# Patient Record
Sex: Female | Born: 1990 | Race: White | Hispanic: Yes | Marital: Married | Smoking: Never smoker
Health system: Southern US, Community
[De-identification: ages and names within clinical notes are randomized; demographics above are authoritative.]

## PROBLEM LIST (undated history)

## (undated) DIAGNOSIS — Z789 Other specified health status: Secondary | ICD-10-CM

## (undated) HISTORY — PX: NO PAST SURGERIES: SHX2092

---
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
## 2010-08-24 ENCOUNTER — Ambulatory Visit: Payer: Self-pay | Admitting: Gynecology

## 2010-10-17 ENCOUNTER — Encounter: Payer: Self-pay | Admitting: Pain Medicine

## 2012-02-29 ENCOUNTER — Ambulatory Visit (INDEPENDENT_AMBULATORY_CARE_PROVIDER_SITE_OTHER): Payer: 59

## 2012-02-29 ENCOUNTER — Ambulatory Visit (INDEPENDENT_AMBULATORY_CARE_PROVIDER_SITE_OTHER): Payer: 59 | Admitting: Gynecology

## 2012-02-29 ENCOUNTER — Encounter: Payer: Self-pay | Admitting: Gynecology

## 2012-02-29 VITALS — BP 110/70 | Ht 62.0 in | Wt 134.5 lb

## 2012-02-29 DIAGNOSIS — R102 Pelvic and perineal pain: Secondary | ICD-10-CM

## 2012-02-29 DIAGNOSIS — Z975 Presence of (intrauterine) contraceptive device: Secondary | ICD-10-CM

## 2012-02-29 DIAGNOSIS — N898 Other specified noninflammatory disorders of vagina: Secondary | ICD-10-CM

## 2012-02-29 DIAGNOSIS — N921 Excessive and frequent menstruation with irregular cycle: Secondary | ICD-10-CM

## 2012-02-29 DIAGNOSIS — R1032 Left lower quadrant pain: Secondary | ICD-10-CM

## 2012-02-29 DIAGNOSIS — N899 Noninflammatory disorder of vagina, unspecified: Secondary | ICD-10-CM

## 2012-02-29 DIAGNOSIS — N949 Unspecified condition associated with female genital organs and menstrual cycle: Secondary | ICD-10-CM

## 2012-02-29 DIAGNOSIS — N8300 Follicular cyst of ovary, unspecified side: Secondary | ICD-10-CM

## 2012-02-29 DIAGNOSIS — R300 Dysuria: Secondary | ICD-10-CM

## 2012-02-29 LAB — URINALYSIS W MICROSCOPIC + REFLEX CULTURE
Crystals: NONE SEEN
Glucose, UA: NEGATIVE mg/dL
Leukocytes, UA: NEGATIVE
Protein, ur: 30 mg/dL — AB
RBC / HPF: NONE SEEN RBC/hpf (ref <3)
WBC, UA: NONE SEEN WBC/hpf (ref <3)
pH: 5.5 (ref 5.0–8.0)

## 2012-02-29 LAB — WET PREP FOR TRICH, YEAST, CLUE
Clue Cells Wet Prep HPF POC: NONE SEEN
Trich, Wet Prep: NONE SEEN
Yeast Wet Prep HPF POC: NONE SEEN

## 2012-02-29 MED ORDER — METRONIDAZOLE 500 MG PO TABS: 500.0000 mg | ORAL_TABLET | Freq: Two times a day (BID) | ORAL | Status: AC

## 2012-02-29 NOTE — Progress Notes (Signed)
Patient is a 21 year old gravida 1 para 1 who is a Holiday representative at [employer] who presented to the office today with a complaint of vaginal irritation slight burning after wiping post urination. On and off she has some right lower quadrant discomfort. And was having some spotting on the Mirena IUD. She had her IUD placed in December 2012 during postpartum visit. She stated she spotted for the first 3 months after its insertion and was treated probably for BV. She also is requesting have her IUD string trimmed.  Exam: Pelvic: Bartholin urethra Skene was within normal limits Vagina: Brownish discharge in the vault Cervix: IUD string seen and trimmed  Ultrasound today for assessment of position of the IUD as well as the right adnexal discomfort that patient has experienced on and off the past few months.  Urinalysis few bacteria specimen submitted for culture Wet prep moderate amount of bacteria and a few white blood cells.  Ultrasound report: Uterus measured 8.8 x 6.1 x 3.3 cm with endometrial stripe of 2.8 mm. IUD was seen in normal position. Right ovary with a small 18 mm follicle otherwise normal. Left R. was normal. No fluid in the cul-de-sac. No masses on either adnexa.  Assessment/plan: Wet prep suspicious for mild BV and she will be placed on Flagyl 500 mg twice a day for 5 days. We'll wait for urine culture results.

## 2012-02-29 NOTE — Patient Instructions (Signed)
Prescription in pharmacy ready to be picked up.

## 2012-03-02 LAB — URINE CULTURE: Colony Count: NO GROWTH

## 2012-03-05 ENCOUNTER — Other Ambulatory Visit: Payer: Self-pay | Admitting: *Deleted

## 2012-03-05 DIAGNOSIS — R8290 Unspecified abnormal findings in urine: Secondary | ICD-10-CM

## 2012-03-06 ENCOUNTER — Telehealth: Payer: Self-pay | Admitting: *Deleted

## 2012-03-06 ENCOUNTER — Other Ambulatory Visit: Payer: 59

## 2012-03-06 DIAGNOSIS — R8290 Unspecified abnormal findings in urine: Secondary | ICD-10-CM

## 2012-03-06 NOTE — Telephone Encounter (Signed)
Pt left urine culture this am and left message asking when will results return? Left message on voicemail that it take a couple of day for results to return.

## 2012-03-07 LAB — URINALYSIS W MICROSCOPIC + REFLEX CULTURE
Bacteria, UA: NONE SEEN
Glucose, UA: NEGATIVE mg/dL
Hgb urine dipstick: NEGATIVE
Leukocytes, UA: NEGATIVE
Nitrite: NEGATIVE
Specific Gravity, Urine: 1.03 (ref 1.005–1.030)
Urobilinogen, UA: 0.2 mg/dL (ref 0.0–1.0)

## 2012-03-09 ENCOUNTER — Telehealth: Payer: Self-pay | Admitting: *Deleted

## 2012-03-09 NOTE — Telephone Encounter (Signed)
Pt informed of recent urine culture results.

## 2013-06-14 ENCOUNTER — Telehealth: Payer: Self-pay | Admitting: *Deleted

## 2013-06-14 NOTE — Telephone Encounter (Signed)
Pt said she has noticed some very light blood after intercourse when wiping. Pt has IUD, no pain with intercourse, no bleeding after. Pt will watch for now and call back if this should become more frequent or heavy or if pain should start.

## 2013-11-22 ENCOUNTER — Encounter: Payer: Self-pay | Admitting: Gynecology

## 2013-11-22 ENCOUNTER — Ambulatory Visit (INDEPENDENT_AMBULATORY_CARE_PROVIDER_SITE_OTHER): Payer: 59 | Admitting: Gynecology

## 2013-11-22 DIAGNOSIS — N949 Unspecified condition associated with female genital organs and menstrual cycle: Secondary | ICD-10-CM

## 2013-11-22 DIAGNOSIS — R102 Pelvic and perineal pain: Secondary | ICD-10-CM

## 2013-11-22 LAB — URINALYSIS W MICROSCOPIC + REFLEX CULTURE
Bilirubin Urine: NEGATIVE
Glucose, UA: NEGATIVE mg/dL
HGB URINE DIPSTICK: NEGATIVE
KETONES UR: NEGATIVE mg/dL
Leukocytes, UA: NEGATIVE
Nitrite: NEGATIVE
PH: 7 (ref 5.0–8.0)
Protein, ur: NEGATIVE mg/dL
Specific Gravity, Urine: 1.02 (ref 1.005–1.030)
Urobilinogen, UA: 1 mg/dL (ref 0.0–1.0)

## 2013-11-22 NOTE — Progress Notes (Signed)
   Patient presented to the office today stating that this morning when she woke up she felt some pressure her left lower abdomen and the first time that she voided. Afterwards she has not had any dysuria, frequency, nausea, vomiting, or any back pain. Patient has a [employer]ear[employer] Adjutant[employer]ina IUD at her last cycle was approximately one week ago. She is in the process of getting married next few months. She is in a monogamous relationship.  Exam: Abdomen: Soft nontender no rebound or guarding Pelvic: And urethra Skene was within normal limits Vagina: No lesions or discharge Cervix: No lesions or discharge IUD string seen Bimanual exam: Uterus anteverted normal size shape and consistency nontender Adnexa: No palpable mass or tenderness Rectal exam: Not done  Urinalysis negative  Assessment/plan: Nonspecific left lower quadrant discomfort isolated event probable ovulatory nature? Patient was reassured. We'll send for urine culture. Patient scheduled to return back For annual exam or when necessary.

## 2013-11-24 LAB — URINE CULTURE
Colony Count: NO GROWTH
Organism ID, Bacteria: NO GROWTH

## 2013-11-26 ENCOUNTER — Telehealth: Payer: Self-pay | Admitting: *Deleted

## 2013-11-26 DIAGNOSIS — R102 Pelvic and perineal pain: Secondary | ICD-10-CM

## 2013-11-26 NOTE — Telephone Encounter (Signed)
Pt was seen on OV 11/22/13 c/o pelvic pain on left side. Pt said on Sunday right side pain started sharp pain, continues, takes OTC but no relief. No urinary problems, nausea, vomiting. Pt has IUD,recommendations? Please advise

## 2013-11-26 NOTE — Telephone Encounter (Signed)
Ultrasound with office visit

## 2013-11-26 NOTE — Telephone Encounter (Signed)
Pt informed, order placed, transferred to front desk. 

## 2013-11-27 ENCOUNTER — Other Ambulatory Visit: Payer: Self-pay | Admitting: Gynecology

## 2013-11-27 ENCOUNTER — Ambulatory Visit (INDEPENDENT_AMBULATORY_CARE_PROVIDER_SITE_OTHER): Payer: 59

## 2013-11-27 ENCOUNTER — Ambulatory Visit (INDEPENDENT_AMBULATORY_CARE_PROVIDER_SITE_OTHER): Payer: 59 | Admitting: Gynecology

## 2013-11-27 DIAGNOSIS — R102 Pelvic and perineal pain unspecified side: Secondary | ICD-10-CM

## 2013-11-27 DIAGNOSIS — Z30432 Encounter for removal of intrauterine contraceptive device: Secondary | ICD-10-CM

## 2013-11-27 DIAGNOSIS — R188 Other ascites: Secondary | ICD-10-CM

## 2013-11-27 DIAGNOSIS — N83209 Unspecified ovarian cyst, unspecified side: Secondary | ICD-10-CM

## 2013-11-27 DIAGNOSIS — R1032 Left lower quadrant pain: Secondary | ICD-10-CM

## 2013-11-27 DIAGNOSIS — N831 Corpus luteum cyst of ovary, unspecified side: Secondary | ICD-10-CM

## 2013-11-27 DIAGNOSIS — N949 Unspecified condition associated with female genital organs and menstrual cycle: Secondary | ICD-10-CM

## 2013-11-27 DIAGNOSIS — N83202 Unspecified ovarian cyst, left side: Secondary | ICD-10-CM

## 2013-11-27 LAB — CBC WITH DIFFERENTIAL/PLATELET
Basophils Absolute: 0 10*3/uL (ref 0.0–0.1)
Basophils Relative: 0 % (ref 0–1)
Eosinophils Absolute: 0.1 10*3/uL (ref 0.0–0.7)
Eosinophils Relative: 2 % (ref 0–5)
HCT: 40.7 % (ref 36.0–46.0)
Hemoglobin: 13.6 g/dL (ref 12.0–15.0)
LYMPHS PCT: 44 % (ref 12–46)
Lymphs Abs: 3.2 10*3/uL (ref 0.7–4.0)
MCH: 28.6 pg (ref 26.0–34.0)
MCHC: 33.4 g/dL (ref 30.0–36.0)
MCV: 85.7 fL (ref 78.0–100.0)
MONO ABS: 0.3 10*3/uL (ref 0.1–1.0)
Monocytes Relative: 4 % (ref 3–12)
NEUTROS ABS: 3.7 10*3/uL (ref 1.7–7.7)
NEUTROS PCT: 50 % (ref 43–77)
PLATELETS: 302 10*3/uL (ref 150–400)
RBC: 4.75 MIL/uL (ref 3.87–5.11)
RDW: 13 % (ref 11.5–15.5)
WBC: 7.3 10*3/uL (ref 4.0–10.5)

## 2013-11-27 LAB — URINALYSIS W MICROSCOPIC + REFLEX CULTURE
BILIRUBIN URINE: NEGATIVE
CRYSTALS: NONE SEEN
Casts: NONE SEEN
Glucose, UA: NEGATIVE mg/dL
KETONES UR: NEGATIVE mg/dL
Nitrite: NEGATIVE
PH: 8.5 — AB (ref 5.0–8.0)
Protein, ur: NEGATIVE mg/dL
SPECIFIC GRAVITY, URINE: 1.015 (ref 1.005–1.030)
Urobilinogen, UA: 1 mg/dL (ref 0.0–1.0)

## 2013-11-27 MED ORDER — TRAMADOL HCL 50 MG PO TABS: 50.0000 mg | ORAL_TABLET | Freq: Four times a day (QID) | ORAL | Status: DC | PRN

## 2013-11-27 MED ORDER — DOXYCYCLINE HYCLATE 100 MG PO CAPS: 100.0000 mg | ORAL_CAPSULE | Freq: Two times a day (BID) | ORAL | Status: DC

## 2013-11-27 NOTE — Patient Instructions (Addendum)

## 2013-11-28 NOTE — Progress Notes (Signed)
   Is a 23 year old who had a Mirena IUD placed 2 years ago was seen in the office once every 27th of this year complaining of left lower abdominal discomfort especially when she voided. She denied any dysuria, frequency, nausea, vomiting or any back pain. She stated that she had a normal menstrual cycle the week prior to that visit. She is in a monogamous relationship in the process of getting married. She had a negative urinalysis during that office visit and the IUD string was visualized and we assumed that the discomfort that she experiences ovulatory nature. Her urine culture came back negative. She called back tissue still having discomfort now is on her right side and in her back although she had been doing some lifting recently. She was asked to return to the office for an ultrasound. She has also decided she wants to have her IUD removed. Her urinalysis was repeated again which demonstrated the following:  WBC: 306 RBC 0-2 Bacteria: Few Culture pending  Ultrasound: Uterus measures 8.9 x 5.8 x 4.3 cm with endometrial stripe of 3.4 mm. Right ovary was normal a left thick wall cyst anterolateral level echoes measuring 22 x 21 x 20 mm was done with color flow in the periphery. There was moderate amount of fluid in the cul-de-sac (50 about 42 x 30 mm)  Exam: Mild right CVA tenderness Abdomen: Soft nontender no rebound or guarding Pelvic: Since we did an ultrasound the IUD was just removed. Was shown to the patient and discarded  Assessment/plan: It appears the discomfort the patient had experienced as a result of a ruptured ovarian cyst. We will check her CBC today. Urine culture pending. She is going to abstain from intercourse until she gets very next few months and then she will use condoms or the oral contraceptive pills. She is scheduled to return back at the end of the month for her anal exam and will reassess at that point. Meanwhile she was given a prescription for Ultram 50 mg one by  mouth every 6 hours when necessary.  She'll return back in July for a followup ultrasound on this ovarian cyst which appears to be benign.

## 2013-11-29 LAB — URINE CULTURE
COLONY COUNT: NO GROWTH
Organism ID, Bacteria: NO GROWTH

## 2013-12-10 ENCOUNTER — Encounter: Payer: Self-pay | Admitting: Gynecology

## 2014-03-05 ENCOUNTER — Telehealth: Payer: Self-pay

## 2014-03-05 NOTE — Telephone Encounter (Signed)
Has Mirena IUD removed in early March and menses have been regular since.  However this month is seems to be delayed by a week or so but she does feel like she might start anytime.  She is inquiring if that is normal after removal IUD that menses could be regular and then be irregular.

## 2014-03-05 NOTE — Telephone Encounter (Signed)
Yes. Cycle are considered normal q 21-35 days.

## 2014-03-05 NOTE — Telephone Encounter (Signed)
Patient advised.

## 2014-07-28 ENCOUNTER — Encounter: Payer: Self-pay | Admitting: Gynecology

## 2014-09-26 NOTE — L&D Delivery Note (Signed)
Delivery Note At 4:31 PM a viable female was delivered via Vaginal, Spontaneous Delivery (Presentation: Right Occiput Anterior).  APGAR: 9, 9; weight  .   Placenta status: , Spontaneous.  Cord: 3 vessels with the following complications: .  Cord pH: not sent  Anesthesia: Epidural  Episiotomy: None Lacerations: 2nd degree;Periurethral Suture Repair: 3.0 vicryl rapide Est. Blood Loss (mL):  300  Mom to postpartum.  Baby to Couplet care / Skin to Skin.  Meriel Pica 06/29/2015, 4:58 PM

## 2014-11-27 LAB — OB RESULTS CONSOLE HIV ANTIBODY (ROUTINE TESTING): HIV: NONREACTIVE

## 2014-11-27 LAB — OB RESULTS CONSOLE RUBELLA ANTIBODY, IGM: RUBELLA: IMMUNE

## 2014-11-27 LAB — OB RESULTS CONSOLE RPR: RPR: NONREACTIVE

## 2015-06-04 LAB — OB RESULTS CONSOLE GC/CHLAMYDIA
CHLAMYDIA, DNA PROBE: NEGATIVE
Gonorrhea: NEGATIVE

## 2015-06-29 ENCOUNTER — Inpatient Hospital Stay (HOSPITAL_COMMUNITY): Payer: 59 | Admitting: Anesthesiology

## 2015-06-29 ENCOUNTER — Encounter (HOSPITAL_COMMUNITY): Payer: Self-pay | Admitting: *Deleted

## 2015-06-29 ENCOUNTER — Inpatient Hospital Stay (HOSPITAL_COMMUNITY)
Admission: AD | Admit: 2015-06-29 | Discharge: 2015-07-01 | DRG: 775 | Disposition: A | Discharge status: Home / Self Care | Payer: 59 | Source: Ambulatory Visit | Attending: Obstetrics and Gynecology | Admitting: Obstetrics and Gynecology

## 2015-06-29 DIAGNOSIS — Z3A39 39 weeks gestation of pregnancy: Secondary | ICD-10-CM

## 2015-06-29 DIAGNOSIS — IMO0001 Reserved for inherently not codable concepts without codable children: Secondary | ICD-10-CM

## 2015-06-29 HISTORY — DX: Other specified health status: Z78.9

## 2015-06-29 LAB — CBC
HCT: 36.9 % (ref 36.0–46.0)
Hemoglobin: 12.5 g/dL (ref 12.0–15.0)
MCH: 28.9 pg (ref 26.0–34.0)
MCHC: 33.9 g/dL (ref 30.0–36.0)
MCV: 85.4 fL (ref 78.0–100.0)
PLATELETS: 193 10*3/uL (ref 150–400)
RBC: 4.32 MIL/uL (ref 3.87–5.11)
RDW: 13.1 % (ref 11.5–15.5)
WBC: 9.7 10*3/uL (ref 4.0–10.5)

## 2015-06-29 LAB — TYPE AND SCREEN
ABO/RH(D): O POS
Antibody Screen: NEGATIVE

## 2015-06-29 LAB — ABO/RH: ABO/RH(D): O POS

## 2015-06-29 LAB — OB RESULTS CONSOLE GBS: STREP GROUP B AG: NEGATIVE

## 2015-06-29 MED ORDER — FENTANYL 2.5 MCG/ML BUPIVACAINE 1/10 % EPIDURAL INFUSION (WH - ANES): 14.0000 mL/h | INTRAMUSCULAR | Status: DC | PRN

## 2015-06-29 MED ORDER — ONDANSETRON HCL 4 MG PO TABS: 4.0000 mg | ORAL_TABLET | ORAL | Status: DC | PRN

## 2015-06-29 MED ORDER — ZOLPIDEM TARTRATE 5 MG PO TABS: 5.0000 mg | ORAL_TABLET | Freq: Every evening | ORAL | Status: DC | PRN

## 2015-06-29 MED ORDER — ONDANSETRON HCL 4 MG/2ML IJ SOLN
4.0000 mg | Freq: Four times a day (QID) | INTRAMUSCULAR | Status: DC | PRN
  Administered 2015-06-29: 4 mg via INTRAVENOUS
  Filled 2015-06-29: qty 2

## 2015-06-29 MED ORDER — EPHEDRINE 5 MG/ML INJ: 10.0000 mg | INTRAVENOUS | Status: DC | PRN

## 2015-06-29 MED ORDER — DIBUCAINE 1 % RE OINT: 1.0000 "application " | TOPICAL_OINTMENT | RECTAL | Status: DC | PRN

## 2015-06-29 MED ORDER — BISACODYL 10 MG RE SUPP: 10.0000 mg | Freq: Every day | RECTAL | Status: DC | PRN

## 2015-06-29 MED ORDER — OXYTOCIN BOLUS FROM INFUSION: 500.0000 mL | INTRAVENOUS | Status: DC

## 2015-06-29 MED ORDER — OXYTOCIN 40 UNITS IN LACTATED RINGERS INFUSION - SIMPLE MED
62.5000 mL/h | INTRAVENOUS | Status: DC
  Filled 2015-06-29: qty 1000

## 2015-06-29 MED ORDER — CITRIC ACID-SODIUM CITRATE 334-500 MG/5ML PO SOLN: 30.0000 mL | ORAL | Status: DC | PRN

## 2015-06-29 MED ORDER — SIMETHICONE 80 MG PO CHEW: 80.0000 mg | CHEWABLE_TABLET | ORAL | Status: DC | PRN

## 2015-06-29 MED ORDER — DIPHENHYDRAMINE HCL 25 MG PO CAPS: 25.0000 mg | ORAL_CAPSULE | Freq: Four times a day (QID) | ORAL | Status: DC | PRN

## 2015-06-29 MED ORDER — TERBUTALINE SULFATE 1 MG/ML IJ SOLN
0.2500 mg | Freq: Once | INTRAMUSCULAR | Status: DC | PRN
Start: 2015-06-29 — End: 2015-06-29

## 2015-06-29 MED ORDER — OXYTOCIN 40 UNITS IN LACTATED RINGERS INFUSION - SIMPLE MED
1.0000 m[IU]/min | INTRAVENOUS | Status: DC
  Administered 2015-06-29: 2 m[IU]/min via INTRAVENOUS

## 2015-06-29 MED ORDER — OXYCODONE-ACETAMINOPHEN 5-325 MG PO TABS: 1.0000 | ORAL_TABLET | ORAL | Status: DC | PRN

## 2015-06-29 MED ORDER — OXYCODONE-ACETAMINOPHEN 5-325 MG PO TABS: 2.0000 | ORAL_TABLET | ORAL | Status: DC | PRN

## 2015-06-29 MED ORDER — FENTANYL 2.5 MCG/ML BUPIVACAINE 1/10 % EPIDURAL INFUSION (WH - ANES)
14.0000 mL/h | INTRAMUSCULAR | Status: DC | PRN
  Filled 2015-06-29: qty 125

## 2015-06-29 MED ORDER — PHENYLEPHRINE 40 MCG/ML (10ML) SYRINGE FOR IV PUSH (FOR BLOOD PRESSURE SUPPORT)
80.0000 ug | PREFILLED_SYRINGE | INTRAVENOUS | Status: DC | PRN
  Administered 2015-06-29 (×2): 80 ug via INTRAVENOUS
  Filled 2015-06-29: qty 20

## 2015-06-29 MED ORDER — MEASLES, MUMPS & RUBELLA VAC ~~LOC~~ INJ: 0.5000 mL | INJECTION | Freq: Once | SUBCUTANEOUS | Status: DC

## 2015-06-29 MED ORDER — LANOLIN HYDROUS EX OINT: TOPICAL_OINTMENT | CUTANEOUS | Status: DC | PRN

## 2015-06-29 MED ORDER — FLEET ENEMA 7-19 GM/118ML RE ENEM: 1.0000 | ENEMA | Freq: Every day | RECTAL | Status: DC | PRN

## 2015-06-29 MED ORDER — WITCH HAZEL-GLYCERIN EX PADS
1.0000 "application " | MEDICATED_PAD | CUTANEOUS | Status: DC | PRN
  Administered 2015-06-30: 1 via TOPICAL

## 2015-06-29 MED ORDER — TETANUS-DIPHTH-ACELL PERTUSSIS 5-2.5-18.5 LF-MCG/0.5 IM SUSP: 0.5000 mL | Freq: Once | INTRAMUSCULAR | Status: DC

## 2015-06-29 MED ORDER — LIDOCAINE HCL (PF) 1 % IJ SOLN
30.0000 mL | INTRAMUSCULAR | Status: DC | PRN
  Administered 2015-06-29: 30 mL via SUBCUTANEOUS
  Filled 2015-06-29: qty 30

## 2015-06-29 MED ORDER — LACTATED RINGERS IV SOLN: 500.0000 mL | INTRAVENOUS | Status: DC | PRN

## 2015-06-29 MED ORDER — SENNOSIDES-DOCUSATE SODIUM 8.6-50 MG PO TABS
2.0000 | ORAL_TABLET | ORAL | Status: DC
  Administered 2015-06-29 – 2015-06-30 (×2): 2 via ORAL
  Filled 2015-06-29 (×2): qty 2

## 2015-06-29 MED ORDER — INFLUENZA VAC SPLIT QUAD 0.5 ML IM SUSY
0.5000 mL | PREFILLED_SYRINGE | INTRAMUSCULAR | Status: AC
  Administered 2015-06-30: 0.5 mL via INTRAMUSCULAR
  Filled 2015-06-29: qty 0.5

## 2015-06-29 MED ORDER — DIPHENHYDRAMINE HCL 50 MG/ML IJ SOLN: 12.5000 mg | INTRAMUSCULAR | Status: DC | PRN

## 2015-06-29 MED ORDER — ONDANSETRON HCL 4 MG/2ML IJ SOLN: 4.0000 mg | INTRAMUSCULAR | Status: DC | PRN

## 2015-06-29 MED ORDER — FLEET ENEMA 7-19 GM/118ML RE ENEM: 1.0000 | ENEMA | RECTAL | Status: DC | PRN

## 2015-06-29 MED ORDER — ACETAMINOPHEN 325 MG PO TABS
650.0000 mg | ORAL_TABLET | ORAL | Status: DC | PRN
Start: 2015-06-29 — End: 2015-06-29

## 2015-06-29 MED ORDER — ACETAMINOPHEN 325 MG PO TABS: 650.0000 mg | ORAL_TABLET | ORAL | Status: DC | PRN

## 2015-06-29 MED ORDER — BENZOCAINE-MENTHOL 20-0.5 % EX AERO
1.0000 "application " | INHALATION_SPRAY | CUTANEOUS | Status: DC | PRN
  Administered 2015-06-29: 1 via TOPICAL
  Filled 2015-06-29: qty 56

## 2015-06-29 MED ORDER — LACTATED RINGERS IV SOLN
INTRAVENOUS | Status: DC
  Administered 2015-06-29: 08:00:00 via INTRAVENOUS

## 2015-06-29 MED ORDER — IBUPROFEN 800 MG PO TABS
800.0000 mg | ORAL_TABLET | Freq: Three times a day (TID) | ORAL | Status: DC | PRN
  Administered 2015-06-29 – 2015-07-01 (×4): 800 mg via ORAL
  Filled 2015-06-29 (×4): qty 1

## 2015-06-29 MED ORDER — PRENATAL MULTIVITAMIN CH
1.0000 | ORAL_TABLET | Freq: Every day | ORAL | Status: DC
  Administered 2015-06-30: 1 via ORAL
  Filled 2015-06-29: qty 1

## 2015-06-29 NOTE — MAU Note (Signed)
Pt presents with regular contractions for the last 4 hours. Denies bleeding or ROM.

## 2015-06-29 NOTE — H&P (Signed)
Deanna Aguirre is a 24 y.o. female presenting for SOL. Maternal Medical History:  Reason for admission: Contractions.   Contractions: Onset was 3-5 hours ago.   Frequency: regular.   Perceived severity is moderate.    Fetal activity: Perceived fetal activity is normal.   Last perceived fetal movement was within the past hour.      OB History    Gravida Para Term Preterm AB TAB SAB Ectopic Multiple Living   Past Medical History  Diagnosis Date  . Medical history non-contributory    Past Surgical History  Procedure Laterality Date  . No past surgeries     Family History: family history is not on file. Social History:  reports that she has never smoked. She does not have any smokeless tobacco history on file. She reports that she does not drink alcohol or use illicit drugs.   Prenatal Transfer Tool  Maternal Diabetes: No Genetic Screening: Normal Maternal Ultrasounds/Referrals: Normal Fetal Ultrasounds or other Referrals:  None Maternal Substance Abuse:  No Significant Maternal Medications:  None Significant Maternal Lab Results:  None Other Comments:  None  ROS  Dilation: 5 Effacement (%): 60 Station: -2 Exam by:: L. Paschal, Rn  Blood pressure 129/70, pulse 87, temperature 97.6 F (36.4 C), temperature source Oral, resp. rate 16, height  (1.626 m), weight 158 lb (71.668 kg), SpO2 99 %. Exam Physical Exam  Constitutional: She is oriented to person, place, and time. She appears well-developed and well-nourished.  HENT:  Head: Normocephalic and atraumatic.  Neck: Normal range of motion. Neck supple.  Cardiovascular: Normal rate and regular rhythm.   Respiratory: Effort normal and breath sounds normal.  GI:  Term FH FHR 148  Genitourinary:  5/C/BOW I  Musculoskeletal: Normal range of motion.  Neurological: She is alert and oriented to person, place, and time.    Prenatal labs: ABO, Rh:   Antibody:   Rubella:   RPR:    HBsAg:    HIV:     GBS:   NEG  Assessment/Plan: Term IUP>>labor   Chelsey Kimberley M 06/29/2015, 8:15 AM

## 2015-06-29 NOTE — Lactation Note (Signed)
This note was copied from the chart of Deanna Aguirre. Lactation Consultation Note  Patient Name: Deanna Aguirre ZOXWR'U Date: 06/29/2015 Reason for consult: Initial assessment Mom has 40m bf experience. She stated the everything went well with her older daughter until her milk dried up after she returned to school. Upon entry the baby was being passed around to different families members. Baby was swaddled then wrapped a larger blanket. Baby started to scream, when FOB unwrapped baby to attempt latching she was red and hot. Baby was not interested in the breast or sucking on a finger. Mom did not seem like she really wanted hands on help. After 2-3 minutes of attempting latch mom put baby skin to skin and she stopped crying. Mom made the comment that she would just formula feed if the baby keeps crying, she stated that the baby is very hungry but does not how to latch yet. Briefly talked about milk production, feeding frequency in the first 24 hr, artificial nipples, and soothing. Mom seemed to be shutting down. Left lactation information and informed her of lactation services along with support group.      Maternal Data Has patient been taught Hand Expression?: Yes Does the patient have breastfeeding experience prior to this delivery?: Yes  Feeding Feeding Type: Breast Fed Length of feed: 0 min (baby was screaming then fell asleep )  LATCH Score/Interventions                      Lactation Tools Discussed/Used     Consult Status Consult Status: Follow-up Date: 06/30/15 Follow-up type: In-patient    Rulon Eisenmenger 06/29/2015, 8:25 PM

## 2015-06-29 NOTE — Anesthesia Preprocedure Evaluation (Signed)

## 2015-06-29 NOTE — Progress Notes (Signed)
Epidural catheter removed. Catheter tip intact. 18:00

## 2015-06-30 ENCOUNTER — Inpatient Hospital Stay (HOSPITAL_COMMUNITY): Admission: RE | Admit: 2015-06-30 | Payer: 59 | Source: Ambulatory Visit

## 2015-06-30 LAB — CBC
HEMATOCRIT: 29.5 % — AB (ref 36.0–46.0)
Hemoglobin: 10 g/dL — ABNORMAL LOW (ref 12.0–15.0)
MCH: 29.1 pg (ref 26.0–34.0)
MCHC: 33.9 g/dL (ref 30.0–36.0)
MCV: 85.8 fL (ref 78.0–100.0)
Platelets: 172 10*3/uL (ref 150–400)
RBC: 3.44 MIL/uL — ABNORMAL LOW (ref 3.87–5.11)
RDW: 13.2 % (ref 11.5–15.5)
WBC: 10.6 10*3/uL — AB (ref 4.0–10.5)

## 2015-06-30 LAB — RPR: RPR: NONREACTIVE

## 2015-06-30 NOTE — Anesthesia Postprocedure Evaluation (Signed)
  Anesthesia Post-op Note  Patient: Deanna Aguirre  Procedure(s) Performed: * No procedures listed *  Patient Location: Mother/Baby  Anesthesia Type:Epidural  Level of Consciousness: awake  Airway and Oxygen Therapy: Patient Spontanous Breathing  Post-op Pain: mild  Post-op Assessment: Patient's Cardiovascular Status Stable and Respiratory Function Stable              Post-op Vital Signs: stable  Last Vitals:  Filed Vitals:   06/30/15 0545  BP: 102/66  Pulse: 61  Temp: 36.4 C  Resp: 18    Complications: No apparent anesthesia complications

## 2015-06-30 NOTE — Progress Notes (Signed)
Post Partum Day 1 Subjective: no complaints, up ad lib, voiding and tolerating PO  Objective: Blood pressure 114/71, pulse 59, temperature 97.7 F (36.5 C), temperature source Oral, resp. rate 18, height  (1.626 m), weight 158 lb (71.668 kg), SpO2 100 %, unknown if currently breastfeeding.  Physical Exam:  General: alert, cooperative, appears stated age and no distress Lochia: appropriate Uterine Fundus: firm Incision: healing well DVT Evaluation: No evidence of DVT seen on physical exam.   Recent Labs  06/29/15 0755 06/30/15 0508  HGB 12.5 10.0*  HCT 36.9 29.5*    Assessment/Plan: Plan for discharge tomorrow and Breastfeeding   LOS: 1 day   Tagen Brethauer C 06/30/2015, 10:14 AM

## 2015-07-01 MED ORDER — IBUPROFEN 800 MG PO TABS: 800.0000 mg | ORAL_TABLET | Freq: Three times a day (TID) | ORAL | Status: DC | PRN

## 2015-07-01 NOTE — Lactation Note (Signed)
This note was copied from the chart of [name]. Lactation Consultation Note  Follow up with mom prior to D/C. Mom BF first child for 3 months and reported that milk supply dwindled when she returned to school. Infant had just fed when I was in room. Mom's RN encouraged mom to call for next feeding for feeding assessment. Infant with 11 BF 5 voids and 3 stools in last 24 hours. Mom reports that infant does not open wide and often has her upper lip curled in, which mom flanges as needed. Mom reports minimal nipple soreness, faint bruise noted to upper right nipple, encouraged mom to express colostrum and let air dry, mom reports she has been doing this. Mom has large breasts with evert nipples, she reports they are feeling fuller today. BF Basics reviewed including using more pillows to support infant at breast. Discussed continuing to feed 8-12 x in 24 hours at first feeding cues. Enc mom to maintain I/O records. Referred mom to BF Information in Taking Care of Baby and Me Booklet. Enc mom to avoid pacifiers for first 2 weeks and reasonings, she has been using pacifier on infant in hospital that she brought from home. [facility] Brochure discussed, reiterated Support Groups and OP services. Mom is to call Ped to make follow up appointment. Mom has a Medela DEBP @ home. Discussed pumping and introduction of bottles between 4-6 weeks. Enc. Mom to call with questions/concerns.  Patient Name: [name] Date: 07/01/2015 Reason for consult: Follow-up assessment   Maternal Data Formula Feeding for Exclusion: No Does the patient have breastfeeding experience prior to this delivery?: Yes  Feeding Feeding Type: Breast Fed Length of feed: 15 min  LATCH Score/Interventions                      Lactation Tools Discussed/Used     Consult Status Consult Status: Complete Follow-up type: Call as needed    Ed Blalock 07/01/2015, 8:23 AM

## 2015-07-01 NOTE — Discharge Summary (Signed)
Obstetric Discharge Summary Reason for Admission: onset of labor Prenatal Procedures: ultrasound Intrapartum Procedures: spontaneous vaginal delivery Postpartum Procedures: none Complications-Operative and Postpartum: 2nd degree perineal laceration HEMOGLOBIN  Date Value Ref Range Status  06/30/2015 10.0* 12.0 - 15.0 g/dL Final   HCT  Date Value Ref Range Status  06/30/2015 29.5* 36.0 - 46.0 % Final    Physical Exam:  General: alert and cooperative Lochia: appropriate Uterine Fundus: firm Incision: na/ DVT Evaluation: No evidence of DVT seen on physical exam.  Discharge Diagnoses: Term Pregnancy-delivered  Discharge Information: Date: 07/01/2015 Activity: pelvic rest Diet: routine Medications: PNV and Ibuprofen Condition: stable Instructions: refer to practice specific booklet Discharge to: home Follow-up Information    Schedule an appointment as soon as possible for a visit in 6 weeks to follow up.      Newborn Data: Live born female  Birth Weight: 8 lb 8.2 oz (3861 g) APGAR: 9, 9  Home with mother.  Deanna Aguirre 07/01/2015, 8:54 AM

## 2016-09-26 NOTE — L&D Delivery Note (Signed)
Delivery Note At 8:07 AM a viable female was delivered via Vaginal, Spontaneous (Presentation: LOA;  ).  APGAR: 9, 10; weight  pending.   Placenta status: routine, .  Cord: WNL with the following complications: nuchal cord x 1, reduced with delivery, body cord x 1, reduced with delivery. Cord pH: not sent  Anesthesia:   Episiotomy: None Lacerations: Vaginal;1st degree;Perineal Suture Repair: 2.0 3.0 vicryl Est. Blood Loss (mL): 200  It's a girl - "Deanna Aguirre" to join her two sisters at home!! Mom to postpartum.  Baby to Couplet care / Skin to Skin.   Deanna Aguirre Deanna Aguirre Deanna Aguirre 09/16/2017, 8:48 AM

## 2017-01-03 ENCOUNTER — Encounter: Payer: Self-pay | Admitting: Gynecology

## 2017-02-08 ENCOUNTER — Encounter: Payer: Self-pay | Admitting: Gynecology

## 2017-06-20 LAB — OB RESULTS CONSOLE HIV ANTIBODY (ROUTINE TESTING): HIV: NONREACTIVE

## 2017-08-30 LAB — OB RESULTS CONSOLE GBS: STREP GROUP B AG: NEGATIVE

## 2017-09-16 ENCOUNTER — Inpatient Hospital Stay (HOSPITAL_COMMUNITY)
Admission: AD | Admit: 2017-09-16 | Discharge: 2017-09-17 | DRG: 807 | Disposition: A | Discharge status: Home / Self Care | Payer: BLUE CROSS/BLUE SHIELD | Source: Ambulatory Visit | Attending: Obstetrics and Gynecology | Admitting: Obstetrics and Gynecology

## 2017-09-16 ENCOUNTER — Other Ambulatory Visit: Payer: Self-pay

## 2017-09-16 ENCOUNTER — Encounter (HOSPITAL_COMMUNITY): Payer: Self-pay | Admitting: *Deleted

## 2017-09-16 ENCOUNTER — Inpatient Hospital Stay (HOSPITAL_COMMUNITY): Payer: BLUE CROSS/BLUE SHIELD | Admitting: Anesthesiology

## 2017-09-16 DIAGNOSIS — Z3A38 38 weeks gestation of pregnancy: Secondary | ICD-10-CM | POA: Diagnosis not present

## 2017-09-16 DIAGNOSIS — Z3483 Encounter for supervision of other normal pregnancy, third trimester: Secondary | ICD-10-CM | POA: Diagnosis present

## 2017-09-16 LAB — CBC
HCT: 38 % (ref 36.0–46.0)
Hemoglobin: 12.9 g/dL (ref 12.0–15.0)
MCH: 28.2 pg (ref 26.0–34.0)
MCHC: 33.9 g/dL (ref 30.0–36.0)
MCV: 83 fL (ref 78.0–100.0)
PLATELETS: 198 10*3/uL (ref 150–400)
RBC: 4.58 MIL/uL (ref 3.87–5.11)
RDW: 14.1 % (ref 11.5–15.5)
WBC: 8.1 10*3/uL (ref 4.0–10.5)

## 2017-09-16 LAB — RPR: RPR: NONREACTIVE

## 2017-09-16 LAB — TYPE AND SCREEN
ABO/RH(D): O POS
Antibody Screen: NEGATIVE

## 2017-09-16 LAB — POCT FERN TEST: POCT Fern Test: POSITIVE

## 2017-09-16 LAB — ABO/RH: ABO/RH(D): O POS

## 2017-09-16 MED ORDER — TETANUS-DIPHTH-ACELL PERTUSSIS 5-2.5-18.5 LF-MCG/0.5 IM SUSP: 0.5000 mL | Freq: Once | INTRAMUSCULAR | Status: DC

## 2017-09-16 MED ORDER — DIBUCAINE 1 % RE OINT: 1.0000 "application " | TOPICAL_OINTMENT | RECTAL | Status: DC | PRN

## 2017-09-16 MED ORDER — LACTATED RINGERS IV SOLN
500.0000 mL | INTRAVENOUS | Status: DC | PRN
  Administered 2017-09-16: 1000 mL via INTRAVENOUS

## 2017-09-16 MED ORDER — LIDOCAINE HCL (PF) 1 % IJ SOLN
INTRAMUSCULAR | Status: DC | PRN
  Administered 2017-09-16 (×2): 4 mL via EPIDURAL

## 2017-09-16 MED ORDER — IBUPROFEN 600 MG PO TABS
600.0000 mg | ORAL_TABLET | Freq: Four times a day (QID) | ORAL | Status: DC
  Administered 2017-09-16 – 2017-09-17 (×3): 600 mg via ORAL
  Filled 2017-09-16 (×3): qty 1

## 2017-09-16 MED ORDER — LACTATED RINGERS IV SOLN: 500.0000 mL | Freq: Once | INTRAVENOUS | Status: AC

## 2017-09-16 MED ORDER — FENTANYL 2.5 MCG/ML BUPIVACAINE 1/10 % EPIDURAL INFUSION (WH - ANES)
14.0000 mL/h | INTRAMUSCULAR | Status: DC | PRN
  Administered 2017-09-16: 14 mL/h via EPIDURAL
  Filled 2017-09-16: qty 100

## 2017-09-16 MED ORDER — ACETAMINOPHEN 325 MG PO TABS: 650.0000 mg | ORAL_TABLET | ORAL | Status: DC | PRN

## 2017-09-16 MED ORDER — DIPHENHYDRAMINE HCL 25 MG PO CAPS: 25.0000 mg | ORAL_CAPSULE | Freq: Four times a day (QID) | ORAL | Status: DC | PRN

## 2017-09-16 MED ORDER — WITCH HAZEL-GLYCERIN EX PADS: 1.0000 "application " | MEDICATED_PAD | CUTANEOUS | Status: DC | PRN

## 2017-09-16 MED ORDER — SENNOSIDES-DOCUSATE SODIUM 8.6-50 MG PO TABS
2.0000 | ORAL_TABLET | ORAL | Status: DC
  Administered 2017-09-16: 2 via ORAL
  Filled 2017-09-16: qty 2

## 2017-09-16 MED ORDER — OXYTOCIN BOLUS FROM INFUSION
500.0000 mL | Freq: Once | INTRAVENOUS | Status: AC
  Administered 2017-09-16: 500 mL via INTRAVENOUS

## 2017-09-16 MED ORDER — OXYCODONE HCL 5 MG PO TABS: 5.0000 mg | ORAL_TABLET | ORAL | Status: DC | PRN

## 2017-09-16 MED ORDER — SIMETHICONE 80 MG PO CHEW: 80.0000 mg | CHEWABLE_TABLET | ORAL | Status: DC | PRN

## 2017-09-16 MED ORDER — EPHEDRINE 5 MG/ML INJ
10.0000 mg | INTRAVENOUS | Status: DC | PRN
  Filled 2017-09-16: qty 2

## 2017-09-16 MED ORDER — OXYCODONE-ACETAMINOPHEN 5-325 MG PO TABS: 1.0000 | ORAL_TABLET | ORAL | Status: DC | PRN

## 2017-09-16 MED ORDER — SOD CITRATE-CITRIC ACID 500-334 MG/5ML PO SOLN: 30.0000 mL | ORAL | Status: DC | PRN

## 2017-09-16 MED ORDER — ONDANSETRON HCL 4 MG PO TABS: 4.0000 mg | ORAL_TABLET | ORAL | Status: DC | PRN

## 2017-09-16 MED ORDER — PHENYLEPHRINE 40 MCG/ML (10ML) SYRINGE FOR IV PUSH (FOR BLOOD PRESSURE SUPPORT)
80.0000 ug | PREFILLED_SYRINGE | INTRAVENOUS | Status: DC | PRN
  Filled 2017-09-16: qty 10
  Filled 2017-09-16: qty 5

## 2017-09-16 MED ORDER — ZOLPIDEM TARTRATE 5 MG PO TABS: 5.0000 mg | ORAL_TABLET | Freq: Every evening | ORAL | Status: DC | PRN

## 2017-09-16 MED ORDER — ONDANSETRON HCL 4 MG/2ML IJ SOLN: 4.0000 mg | Freq: Four times a day (QID) | INTRAMUSCULAR | Status: DC | PRN

## 2017-09-16 MED ORDER — PHENYLEPHRINE 40 MCG/ML (10ML) SYRINGE FOR IV PUSH (FOR BLOOD PRESSURE SUPPORT)
80.0000 ug | PREFILLED_SYRINGE | INTRAVENOUS | Status: AC | PRN
  Administered 2017-09-16 (×3): 80 ug via INTRAVENOUS
  Filled 2017-09-16: qty 10

## 2017-09-16 MED ORDER — BENZOCAINE-MENTHOL 20-0.5 % EX AERO
1.0000 "application " | INHALATION_SPRAY | CUTANEOUS | Status: DC | PRN
  Administered 2017-09-16: 1 via TOPICAL
  Filled 2017-09-16: qty 56

## 2017-09-16 MED ORDER — FLEET ENEMA 7-19 GM/118ML RE ENEM: 1.0000 | ENEMA | RECTAL | Status: DC | PRN

## 2017-09-16 MED ORDER — LACTATED RINGERS IV SOLN
INTRAVENOUS | Status: DC
  Administered 2017-09-16: 06:00:00 via INTRAVENOUS

## 2017-09-16 MED ORDER — ONDANSETRON HCL 4 MG/2ML IJ SOLN: 4.0000 mg | INTRAMUSCULAR | Status: DC | PRN

## 2017-09-16 MED ORDER — OXYCODONE-ACETAMINOPHEN 5-325 MG PO TABS: 2.0000 | ORAL_TABLET | ORAL | Status: DC | PRN

## 2017-09-16 MED ORDER — LIDOCAINE HCL (PF) 1 % IJ SOLN
30.0000 mL | INTRAMUSCULAR | Status: DC | PRN
  Filled 2017-09-16: qty 30

## 2017-09-16 MED ORDER — DIPHENHYDRAMINE HCL 50 MG/ML IJ SOLN: 12.5000 mg | INTRAMUSCULAR | Status: DC | PRN

## 2017-09-16 MED ORDER — OXYTOCIN 40 UNITS IN LACTATED RINGERS INFUSION - SIMPLE MED
2.5000 [IU]/h | INTRAVENOUS | Status: DC
  Administered 2017-09-16: 2.5 [IU]/h via INTRAVENOUS
  Filled 2017-09-16: qty 1000

## 2017-09-16 MED ORDER — OXYCODONE HCL 5 MG PO TABS: 10.0000 mg | ORAL_TABLET | ORAL | Status: DC | PRN

## 2017-09-16 MED ORDER — COCONUT OIL OIL
1.0000 "application " | TOPICAL_OIL | Status: DC | PRN
  Filled 2017-09-16: qty 120

## 2017-09-16 MED ORDER — PRENATAL MULTIVITAMIN CH
1.0000 | ORAL_TABLET | Freq: Every day | ORAL | Status: DC
Start: 2017-09-16 — End: 2017-09-17

## 2017-09-16 NOTE — Lactation Note (Signed)
This note was copied from a baby's chart. Lactation Consultation Note  Patient Name: Deanna Aguirre ZOXWR'UToday's Date: 09/16/2017 Reason for consult: Initial assessment;Early term 37-38.6wks Breastfeeding consultation services and support information given and reviewed.  P3 Breastfed first baby but stopped with last baby due to weight loss and reflux.  Baby is 10 hours old and has latched to both breasts.  Mom has also pumped and finger fed colostrum. Instructed to feed with any feeding cue and call for assist prn.  Maternal Data Has patient been taught Hand Expression?: Yes Does the patient have breastfeeding experience prior to this delivery?: Yes  Feeding Feeding Type: Breast Milk  LATCH Score                   Interventions Interventions: DEBP  Lactation Tools Discussed/Used     Consult Status Consult Status: Follow-up Date: 09/17/17 Follow-up type: In-patient    Huston FoleyMOULDEN, Chesley Valls S 09/16/2017, 7:05 PM

## 2017-09-16 NOTE — H&P (Signed)
Deanna Aguirre is a 26 y.o. female presenting for SROM and labor. OB History    Gravida Para Term Preterm AB Living   3 2 2  0 0 2   SAB TAB Ectopic Multiple Live Births   0 0 0   2     Past Medical History:  Diagnosis Date  . Medical history non-contributory    Past Surgical History:  Procedure Laterality Date  . NO PAST SURGERIES     Family History: family history includes Breast cancer (age of onset: 8351) in her maternal aunt; Cancer in her maternal grandmother and paternal grandmother; Diabetes in her sister; Hypertension in her mother. Social History:  reports that  has never smoked. she has never used smokeless tobacco. She reports that she does not drink alcohol or use drugs.     Maternal Diabetes: No Genetic Screening: Normal Maternal Ultrasounds/Referrals: Normal Fetal Ultrasounds or other Referrals:  None Maternal Substance Abuse:  No Significant Maternal Medications:  None Significant Maternal Lab Results:  None Other Comments:  None  ROS History Dilation: 8 Effacement (%): 70 Station: -2 Exam by:: Dr. Elon SpannerLeger Blood pressure (!) 112/92, pulse 86, temperature 97.9 F (36.6 C), temperature source Oral, resp. rate 19, height 5\' 3"  (1.6 m), weight 77.6 kg (171 lb), SpO2 98 %, unknown if currently breastfeeding. Exam Physical Exam  Prenatal labs: ABO, Rh: --/--/O POS (12/22 0448) Antibody: NEG (12/22 0448) Rubella:   RPR:    HBsAg:    HIV: Non-reactive (09/25 0000)  GBS: Negative (12/05 0000)   Assessment/Plan: 26 yo G3P2002 @ 38.4wga presenting s/p SROM and in labor. 8cm, LOT. Plan for expectant management for now.  GBS neg.    Deanna Aguirre 09/16/2017, 7:24 AM

## 2017-09-16 NOTE — MAU Note (Signed)
Leaking fld since 0245. Fld is pink tinged. Contracting some. 1cm last sve

## 2017-09-16 NOTE — Anesthesia Procedure Notes (Signed)
Epidural Patient location during procedure: OB Start time: 09/16/2017 5:49 AM  Staffing Anesthesiologist: Leonides GrillsEllender, Elijio Staples P, MD Performed: anesthesiologist   Preanesthetic Checklist Completed: patient identified, site marked, pre-op evaluation, timeout performed, IV checked, risks and benefits discussed and monitors and equipment checked  Epidural Patient position: sitting Prep: DuraPrep Patient monitoring: heart rate, cardiac monitor, continuous pulse ox and blood pressure Approach: midline Location: L4-L5 Injection technique: LOR air  Needle:  Needle type: Tuohy  Needle gauge: 17 G Needle length: 9 cm Needle insertion depth: 6 cm Catheter type: closed end flexible Catheter size: 19 Gauge Catheter at skin depth: 11 cm Test dose: negative and Other  Assessment Events: blood not aspirated, injection not painful, no injection resistance and negative IV test  Additional Notes Informed consent obtained prior to proceeding including risk of failure, 1% risk of PDPH, risk of minor discomfort and bruising. Discussed alternatives to epidural analgesia and patient desires to proceed.  Timeout performed pre-procedure verifying patient name, procedure, and platelet count.  Patient tolerated procedure well. Reason for block:procedure for pain

## 2017-09-16 NOTE — Anesthesia Postprocedure Evaluation (Signed)
Anesthesia Post Note  Patient: Deanna Aguirre  Procedure(s) Performed: AN AD HOC LABOR EPIDURAL     Patient location during evaluation: Mother Baby Anesthesia Type: Epidural Level of consciousness: awake and alert and oriented Pain management: satisfactory to patient Vital Signs Assessment: post-procedure vital signs reviewed and stable Respiratory status: spontaneous breathing and nonlabored ventilation Cardiovascular status: stable Postop Assessment: no headache, no backache, no signs of nausea or vomiting, adequate PO intake and patient able to bend at knees (patient up walking) Anesthetic complications: no    Last Vitals:  Vitals:   09/16/17 0915 09/16/17 1013  BP: 114/67 (!) 112/53  Pulse: 83 79  Resp: 16 16  Temp:    SpO2:  99%    Last Pain:  Vitals:   09/16/17 1059  TempSrc:   PainSc: 0-No pain   Pain Goal: Patients Stated Pain Goal: 0 (09/16/17 0345)               Madison HickmanGREGORY,[name]

## 2017-09-16 NOTE — Progress Notes (Signed)
To BS via stretcher. Pt L lateral for transport

## 2017-09-16 NOTE — Anesthesia Preprocedure Evaluation (Signed)
Anesthesia Evaluation  Patient identified by MRN, date of birth, ID band Patient awake    Reviewed: Allergy & Precautions, H&P , Patient's Chart, lab work & pertinent test results  Airway Mallampati: II  TM Distance: >3 FB Neck ROM: full    Dental  (+) Teeth Intact   Pulmonary neg pulmonary ROS,    Pulmonary exam normal        Cardiovascular negative cardio ROS Normal cardiovascular exam     Neuro/Psych negative neurological ROS  negative psych ROS   GI/Hepatic negative GI ROS, Neg liver ROS,   Endo/Other  negative endocrine ROS  Renal/GU negative Renal ROS     Musculoskeletal   Abdominal   Peds  Hematology negative hematology ROS (+)   Anesthesia Other Findings       Reproductive/Obstetrics (+) Pregnancy                             Anesthesia Physical  Anesthesia Plan  ASA: II  Anesthesia Plan: Epidural   Post-op Pain Management:    Induction:   PONV Risk Score and Plan:   Airway Management Planned:   Additional Equipment:   Intra-op Plan:   Post-operative Plan:   Informed Consent: I have reviewed the patients History and Physical, chart, labs and discussed the procedure including the risks, benefits and alternatives for the proposed anesthesia with the patient or authorized representative who has indicated his/her understanding and acceptance.   Dental Advisory Given  Plan Discussed with:   Anesthesia Plan Comments: (Labs checked- platelets confirmed with RN in room. Fetal heart tracing, per RN, reported to be stable enough for sitting procedure. Discussed epidural, and patient consents to the procedure:  included risk of possible headache,backache, failed block, allergic reaction, and nerve injury.  This patient was asked if she had any questions or concerns before the procedure started.)        Anesthesia Quick Evaluation

## 2017-09-16 NOTE — Progress Notes (Signed)
Dr Elon SpannerLeger notified of pt's admission and status. Aware of ctx pattern, srom with mec fld, baby reassuring but not reactive at this point. [facility]

## 2017-09-17 LAB — CBC
HCT: 35 % — ABNORMAL LOW (ref 36.0–46.0)
Hemoglobin: 11.4 g/dL — ABNORMAL LOW (ref 12.0–15.0)
MCH: 27.2 pg (ref 26.0–34.0)
MCHC: 32.6 g/dL (ref 30.0–36.0)
MCV: 83.5 fL (ref 78.0–100.0)
PLATELETS: 193 10*3/uL (ref 150–400)
RBC: 4.19 MIL/uL (ref 3.87–5.11)
RDW: 14.3 % (ref 11.5–15.5)
WBC: 7.8 10*3/uL (ref 4.0–10.5)

## 2017-09-17 NOTE — Discharge Summary (Signed)
Obstetric Discharge Summary Reason for Admission: onset of labor and rupture of membranes Prenatal Procedures: none Intrapartum Procedures: spontaneous vaginal delivery Postpartum Procedures: none Complications-Operative and Postpartum: 1st degree perineal laceration Hemoglobin  Date Value Ref Range Status  09/17/2017 11.4 (L) 12.0 - 15.0 g/dL Final   HCT  Date Value Ref Range Status  09/17/2017 35.0 (L) 36.0 - 46.0 % Final    Physical Exam:  General: alert, cooperative and appears stated age 52Lochia: appropriate Uterine Fundus: firm Incision: healing well, no significant drainage, no dehiscence, no significant erythema DVT Evaluation: No evidence of DVT seen on physical exam. Negative Homan's sign. No cords or calf tenderness. No significant calf/ankle edema.  Discharge Diagnoses: Term Pregnancy-delivered  Discharge Information: Date: 09/17/2017 Activity: pelvic rest Diet: routine Medications: None Condition: stable Instructions: refer to practice specific booklet Discharge to: home   Newborn Data: Live born female  Birth Weight: 7 lb 5.3 oz (3325 g) APGAR: 9, 10  Newborn Delivery   Birth date/time:  09/16/2017 08:07:00 Delivery type:  Vaginal, Spontaneous     Home with mother.  Deanna Aguirre Deanna Aguirre 09/17/2017, 8:26 AM

## 2017-09-22 NOTE — Addendum Note (Signed)
Addendum  created 09/22/17 0948 by Leonides GrillsEllender, Ryan P, MD   Intraprocedure Event edited

## 2018-07-20 ENCOUNTER — Ambulatory Visit: Payer: BLUE CROSS/BLUE SHIELD | Admitting: Nurse Practitioner

## 2019-04-19 ENCOUNTER — Other Ambulatory Visit: Payer: Self-pay

## 2019-04-19 DIAGNOSIS — Z20822 Contact with and (suspected) exposure to covid-19: Secondary | ICD-10-CM

## 2019-04-23 LAB — NOVEL CORONAVIRUS, NAA: SARS-CoV-2, NAA: NOT DETECTED

## 2019-06-27 ENCOUNTER — Other Ambulatory Visit: Payer: Self-pay

## 2019-06-27 DIAGNOSIS — Z20822 Contact with and (suspected) exposure to covid-19: Secondary | ICD-10-CM

## 2019-06-28 LAB — NOVEL CORONAVIRUS, NAA: SARS-CoV-2, NAA: DETECTED — AB

## 2020-01-09 ENCOUNTER — Emergency Department (HOSPITAL_COMMUNITY): Payer: BC Managed Care – PPO

## 2020-01-09 ENCOUNTER — Other Ambulatory Visit: Payer: Self-pay

## 2020-01-09 ENCOUNTER — Emergency Department (HOSPITAL_COMMUNITY)
Admission: EM | Admit: 2020-01-09 | Discharge: 2020-01-09 | Disposition: A | Discharge status: Home / Self Care | Payer: BC Managed Care – PPO | Attending: Emergency Medicine | Admitting: Emergency Medicine

## 2020-01-09 ENCOUNTER — Encounter (HOSPITAL_COMMUNITY): Payer: Self-pay

## 2020-01-09 DIAGNOSIS — R112 Nausea with vomiting, unspecified: Secondary | ICD-10-CM | POA: Diagnosis not present

## 2020-01-09 DIAGNOSIS — R109 Unspecified abdominal pain: Secondary | ICD-10-CM

## 2020-01-09 DIAGNOSIS — R1032 Left lower quadrant pain: Secondary | ICD-10-CM | POA: Diagnosis present

## 2020-01-09 DIAGNOSIS — N201 Calculus of ureter: Secondary | ICD-10-CM | POA: Insufficient documentation

## 2020-01-09 DIAGNOSIS — N130 Hydronephrosis with ureteropelvic junction obstruction: Secondary | ICD-10-CM | POA: Diagnosis not present

## 2020-01-09 DIAGNOSIS — R319 Hematuria, unspecified: Secondary | ICD-10-CM | POA: Insufficient documentation

## 2020-01-09 LAB — BASIC METABOLIC PANEL
Anion gap: 12 (ref 5–15)
BUN: 8 mg/dL (ref 6–20)
CO2: 23 mmol/L (ref 22–32)
Calcium: 9.4 mg/dL (ref 8.9–10.3)
Chloride: 103 mmol/L (ref 98–111)
Creatinine, Ser: 0.85 mg/dL (ref 0.44–1.00)
GFR calc Af Amer: >60 mL/min (ref >60)
GFR calc non Af Amer: >60 mL/min (ref >60)
Glucose, Bld: 111 mg/dL — ABNORMAL HIGH (ref 70–99)
Potassium: 3.7 mmol/L (ref 3.5–5.1)
Sodium: 138 mmol/L (ref 135–145)

## 2020-01-09 LAB — CBC
HCT: 42.1 % (ref 36.0–46.0)
Hemoglobin: 13.7 g/dL (ref 12.0–15.0)
MCH: 29 pg (ref 26.0–34.0)
MCHC: 32.5 g/dL (ref 30.0–36.0)
MCV: 89 fL (ref 80.0–100.0)
Platelets: 334 10*3/uL (ref 150–400)
RBC: 4.73 MIL/uL (ref 3.87–5.11)
RDW: 12 % (ref 11.5–15.5)
WBC: 10.6 10*3/uL — ABNORMAL HIGH (ref 4.0–10.5)
nRBC: 0 % (ref 0.0–0.2)

## 2020-01-09 LAB — I-STAT BETA HCG BLOOD, ED (MC, WL, AP ONLY): I-stat hCG, quantitative: <5 m[IU]/mL (ref <5)

## 2020-01-09 LAB — URINALYSIS, ROUTINE W REFLEX MICROSCOPIC
Bilirubin Urine: NEGATIVE
Glucose, UA: NEGATIVE mg/dL
Ketones, ur: 20 mg/dL — AB
Leukocytes,Ua: NEGATIVE
Nitrite: NEGATIVE
Protein, ur: 100 mg/dL — AB
RBC / HPF: >50 RBC/hpf — ABNORMAL HIGH (ref 0–5)
Specific Gravity, Urine: 1.026 (ref 1.005–1.030)
pH: 5 (ref 5.0–8.0)

## 2020-01-09 LAB — LIPASE, BLOOD: Lipase: 22 U/L (ref 11–51)

## 2020-01-09 LAB — MAGNESIUM: Magnesium: 1.9 mg/dL (ref 1.7–2.4)

## 2020-01-09 MED ORDER — TAMSULOSIN HCL 0.4 MG PO CAPS: 0.4000 mg | ORAL_CAPSULE | Freq: Every day | ORAL | 0 refills | Status: AC

## 2020-01-09 MED ORDER — FENTANYL CITRATE (PF) 100 MCG/2ML IJ SOLN
50.0000 ug | Freq: Once | INTRAMUSCULAR | Status: AC
  Administered 2020-01-09: 50 ug via INTRAVENOUS
  Filled 2020-01-09: qty 2

## 2020-01-09 MED ORDER — ONDANSETRON HCL 4 MG/2ML IJ SOLN
4.0000 mg | Freq: Once | INTRAMUSCULAR | Status: AC
Start: 2020-01-09 — End: 2020-01-09
  Administered 2020-01-09: 17:00:00 4 mg via INTRAVENOUS
  Filled 2020-01-09: qty 2

## 2020-01-09 MED ORDER — OXYCODONE-ACETAMINOPHEN 5-325 MG PO TABS
1.0000 | ORAL_TABLET | Freq: Once | ORAL | Status: AC
  Administered 2020-01-09: 16:00:00 1 via ORAL
  Filled 2020-01-09: qty 1

## 2020-01-09 MED ORDER — SODIUM CHLORIDE 0.9 % IV BOLUS
1000.0000 mL | Freq: Once | INTRAVENOUS | Status: AC
  Administered 2020-01-09: 18:00:00 1000 mL via INTRAVENOUS

## 2020-01-09 MED ORDER — ONDANSETRON 4 MG PO TBDP: 4.0000 mg | ORAL_TABLET | Freq: Once | ORAL | Status: DC

## 2020-01-09 NOTE — ED Triage Notes (Signed)
Pt arrives to ED w/ c/o R sided flank pain. Pt endorses hematuria. Denies hx of kidney stones. Pt endorses n/v.

## 2020-01-09 NOTE — ED Notes (Signed)
Pt transported to CT ?

## 2020-01-09 NOTE — Discharge Instructions (Addendum)
Please take Ibuprofen 600 mg every 6 hours as needed for pain control

## 2020-01-09 NOTE — ED Provider Notes (Signed)
[facility] EMERGENCY DEPARTMENT Provider Note   CSN: [id number] Arrival date & time: 01/09/20  1526     History Chief Complaint  Patient presents with  . Flank Pain    Deanna Aguirre is a 29 y.o. female.  HPI   29 year old G45, P3 female without pertinent previous history, no previous abdominal or pelvic surgeries presenting to the emerge department complaining of right-sided flank pain with associated hematuria.  Patient denies any previous history of nephrolithiasis or ureterolithiasis.  She does have a family history of kidney stones.  She notes having associated nausea and vomiting.  Patient describes having sudden onset sharp waxing and waning right flank pain radiating to her groin since approximately 1300.  No treatments tried prior to arrival.  Patient has had greater than 5 episodes of NBNB emesis.  Patient describes the pain as severe, 10 pain at its peak.  Patient denies any vaginal bleeding, vaginal discharge, vaginal pain, dyspareunia, dysuria, urinary frequency or urgency.  Patient noted having hematuria when evaluated at an urgent care earlier today.  Patient denies any fevers, chills, cough, congestion, rhinorrhea, shortness of breath, chest pain, myalgias.  Past Medical History:  Diagnosis Date  . Medical history non-contributory     Patient Active Problem List   Diagnosis Date Noted  . Indication for care in labor or delivery 09/16/2017  . Active labor at term 06/29/2015    Past Surgical History:  Procedure Laterality Date  . NO PAST SURGERIES       OB History    Gravida  3   Para  3   Term  3   Preterm  0   AB  0   Living  3     SAB  0   TAB  0   Ectopic  0   Multiple  0   Live Births  3           Family History  Problem Relation Age of Onset  . Hypertension Mother   . Diabetes Sister   . Breast cancer Maternal Aunt 51  . Cancer Maternal Grandmother        UTERINE  . Cancer Paternal Grandmother        OVARIAN      Social History   Tobacco Use  . Smoking status: Never Smoker  . Smokeless tobacco: Never Used  Substance Use Topics  . Alcohol use: No  . Drug use: No    Home Medications Prior to Admission medications   Medication Sig Start Date End Date Taking? Authorizing Provider  tamsulosin (FLOMAX) 0.4 MG CAPS capsule Take 1 capsule (0.4 mg total) by mouth daily for 7 days. 01/09/20 01/16/20  Gracy Bruins, MD    Allergies    Patient has no known allergies.  Review of Systems   Review of Systems  Constitutional: Positive for activity change and appetite change. Negative for chills, diaphoresis, fatigue and fever.  HENT: Negative for congestion and rhinorrhea.   Respiratory: Negative for cough, shortness of breath and wheezing.   Cardiovascular: Negative for chest pain.  Gastrointestinal: Positive for nausea and vomiting. Negative for abdominal distention, abdominal pain and diarrhea.  Genitourinary: Positive for flank pain, hematuria and pelvic pain. Negative for decreased urine volume, difficulty urinating, dyspareunia, dysuria, frequency, menstrual problem, urgency, vaginal bleeding, vaginal discharge and vaginal pain.  Musculoskeletal: Negative for gait problem.  Skin: Negative for color change and wound.  Neurological: Negative for dizziness, syncope, weakness, light-headedness and headaches.  All other systems reviewed  and are negative.   Physical Exam Updated Vital Signs BP 103/62   Pulse 71   Temp 98.4 F (36.9 C)   Resp 18   SpO2 100%   Physical Exam Vitals and nursing note reviewed.  Constitutional:      General: She is in acute distress.     Appearance: Normal appearance. She is normal weight. She is ill-appearing.     Comments: Uncomfortable appearing  HENT:     Head: Normocephalic.     Right Ear: External ear normal.     Left Ear: External ear normal.     Nose: Nose normal.     Mouth/Throat:     Mouth: Mucous membranes are moist.     Pharynx: Oropharynx is  clear.  Eyes:     Extraocular Movements: Extraocular movements intact.  Cardiovascular:     Rate and Rhythm: Normal rate and regular rhythm.     Pulses: Normal pulses.     Heart sounds: Normal heart sounds.  Pulmonary:     Effort: Pulmonary effort is normal. No respiratory distress.     Breath sounds: Normal breath sounds. No wheezing or rhonchi.  Abdominal:     General: Bowel sounds are normal. There is no distension.     Palpations: Abdomen is soft.     Tenderness: There is no abdominal tenderness. There is no right CVA tenderness, left CVA tenderness or guarding.  Musculoskeletal:     Cervical back: Normal range of motion.     Right lower leg: No edema.     Left lower leg: No edema.  Skin:    General: Skin is warm and dry.     Capillary Refill: Capillary refill takes less than 2 seconds.  Neurological:     General: No focal deficit present.     Mental Status: She is alert and oriented to person, place, and time. Mental status is at baseline.  Psychiatric:        Mood and Affect: Mood normal.     ED Results / Procedures / Treatments   Labs (all labs ordered are listed, but only abnormal results are displayed) Labs Reviewed  URINALYSIS, ROUTINE W REFLEX MICROSCOPIC - Abnormal; Notable for the following components:      Result Value   Color, Urine AMBER (*)    APPearance CLOUDY (*)    Hgb urine dipstick LARGE (*)    Ketones, ur 20 (*)    Protein, ur 100 (*)    RBC / HPF >50 (*)    Bacteria, UA FEW (*)    All other components within normal limits  BASIC METABOLIC PANEL - Abnormal; Notable for the following components:   Glucose, Bld 111 (*)    All other components within normal limits  CBC - Abnormal; Notable for the following components:   WBC 10.6 (*)    All other components within normal limits  URINE CULTURE  MAGNESIUM  LIPASE, BLOOD  I-STAT BETA HCG BLOOD, ED ([facility], [facility], [facility] ONLY)    EKG None  Radiology CT Renal Stone Study  Result Date:  01/09/2020 CLINICAL DATA:  Right-sided flank pain, hematuria EXAM: CT ABDOMEN AND PELVIS WITHOUT CONTRAST TECHNIQUE: Multidetector CT imaging of the abdomen and pelvis was performed following the standard protocol without IV contrast. COMPARISON:  None. FINDINGS: Lower chest: No acute pleural or parenchymal lung disease. Hepatobiliary: No focal liver abnormality is seen. No gallstones, gallbladder wall thickening, or biliary dilatation. Pancreas: Unremarkable. No pancreatic ductal dilatation or surrounding inflammatory changes. Spleen:  Normal in size without focal abnormality. Adrenals/Urinary Tract: There is mild right-sided obstructive uropathy related to a 3 mm obstructing right UVJ calculus reference image 78. Left kidney is unremarkable. The bladder is decompressed which limits evaluation. The adrenals are normal. Stomach/Bowel: No bowel obstruction or ileus. Normal appendix right lower quadrant. No bowel wall thickening or inflammatory change. Vascular/Lymphatic: No significant vascular findings are present. No enlarged abdominal or pelvic lymph nodes. Reproductive: Uterus and bilateral adnexa are unremarkable. Other: No abdominal wall hernia or abnormality. No abdominopelvic ascites. Musculoskeletal: No acute or destructive bony lesions. Reconstructed images demonstrate no additional findings. IMPRESSION: 1. Right-sided obstructive uropathy related to a 3 mm right UVJ calculus. Electronically Signed   By: Randa Ngo M.D.   On: 01/09/2020 19:39    Procedures Procedures (including critical care time)  Medications Ordered in ED Medications  oxyCODONE-acetaminophen (PERCOCET/ROXICET) 5-325 MG per tablet 1 tablet (1 tablet Oral Given 01/09/20 1535)  ondansetron (ZOFRAN) injection 4 mg (4 mg Intravenous Given 01/09/20 1704)  fentaNYL (SUBLIMAZE) injection 50 mcg (50 mcg Intravenous Given 01/09/20 1731)  sodium chloride 0.9 % bolus 1,000 mL (0 mLs Intravenous Stopped 01/09/20 1902)    ED Course  I  have reviewed the triage vital signs and the nursing notes.  Pertinent labs & imaging results that were available during my care of the patient were reviewed by me and considered in my medical decision making (see chart for details).    MDM Rules/Calculators/A&P                      29 year old female no pertinent previous history presenting to the emerge department complaining of right-sided flank pain with associated hematuria.  Patient denies any previous history of nephrolithiasis or ureterolithiasis.   Differential diagnoses considered include nephrolithiasis, ureterolithiasis, STI, PID, ectopic pregnancy, menorrhagia, AUB, less likely torsion, no suspicion for other acute intra-abdominal pathology including AAA, dissection, SBO, appendicitis, cholecystitis, pancreatitis, mesenteric ischemia, or other acute emergent intra-abdominal processes  Initial interventions 4 mg IV Zofran and 50 mcg IV fentanyl  Labs demonstrated mild leukocytosis of 10.6, no significant anemia with a hemoglobin of 13.7, platelets 334, negative point-of-care pregnancy test, urinalysis with large hemoglobin, 20 ketones, 100 protein, greater than 50 RBCs increasing my concern for possible nephro/ureterolithiasis, magnesium 1.9, lipase negative at 22  CT abdomen/pelvis stone study demonstrated 3 mm R sided obstructive uropathy related to a 3 mm R UVJ stone  Upon reassessment, the patient's pain is improved significantly.   Given the above findings, my suspicion is that the patient is likley to pass this stone without intervention given it's size. Will d/c the patient with tamsulosin 0.4 mg daily for the next 7 days. Return precautions provided. Recommended ibuprofen 600 mg by mouth every 6 hours as needed for pain control.  Ambulatory referral to urology placed this is the patient's first kidney stone.  The patient is safe and stable for discharge at this time with return precautions provided and a plan for follow up  care in place as needed  The plan for this patient was discussed with Dr. Gilford Raid, who voiced agreement and who oversaw evaluation and treatment of this patient.  Final Clinical Impression(s) / ED Diagnoses Final diagnoses:  Right flank pain  Ureterolithiasis    Rx / DC Orders ED Discharge Orders         Ordered    Ambulatory referral to Urology    Comments: First time 3 mm UVJ stone   01/09/20 1951  tamsulosin (FLOMAX) 0.4 MG CAPS capsule  Daily     01/09/20 1957           Gracy Bruins, MD 01/09/20 Babette Relic    Jacalyn Lefevre, MD 01/09/20 2021

## 2020-01-10 LAB — URINE CULTURE: Culture: 10000 — AB

## 2020-01-12 ENCOUNTER — Telehealth: Payer: Self-pay | Admitting: Emergency Medicine

## 2020-01-12 NOTE — Telephone Encounter (Signed)
Post ED Visit - Positive Culture Follow-up  Culture report reviewed by antimicrobial stewardship pharmacist: [facility] []  , Pharm.D. []  Enzo Bi, Pharm.D., BCPS AQ-ID []  , Pharm.D., BCPS []  Celedonio Miyamoto, Pharm.D., BCPS []  Lafferty, Garvin Fila.D., BCPS, AAHIVP []  , Pharm.D., BCPS, AAHIVP []  Georgina Pillion, PharmD, BCPS []  , PharmD, BCPS []  [place], PharmD, BCPS []  [address], PharmD []  , PharmD, BCPS [x]  Estella Husk, PharmD  Pharmacy Team []  Lysle Pearl, PharmD []  , PharmD []  Phillips Climes, PharmD []  , Rph []  Agapito Games) , PharmD []  Verlan Friends, PharmD []  , PharmD []  Mervyn Gay, PharmD []  , PharmD []  Daylene Posey, PharmD []  [facility], PharmD []  , PharmD []  Len Childs, PharmD   Positive urine culture No further patient follow-up is required at this time.   Ryon Layton 01/12/2020, 5:50 PM

## 2020-06-28 ENCOUNTER — Encounter (HOSPITAL_COMMUNITY): Payer: Self-pay | Admitting: Obstetrics and Gynecology

## 2020-06-28 ENCOUNTER — Inpatient Hospital Stay (EMERGENCY_DEPARTMENT_HOSPITAL)
Admission: AD | Admit: 2020-06-28 | Discharge: 2020-06-28 | Disposition: A | Discharge status: Home / Self Care | Payer: BC Managed Care – PPO | Source: Home / Self Care | Attending: Obstetrics and Gynecology | Admitting: Obstetrics and Gynecology

## 2020-06-28 ENCOUNTER — Inpatient Hospital Stay (HOSPITAL_COMMUNITY): Payer: BC Managed Care – PPO

## 2020-06-28 ENCOUNTER — Inpatient Hospital Stay (HOSPITAL_COMMUNITY)
Admission: AD | Admit: 2020-06-28 | Discharge: 2020-06-28 | Discharge status: Against Medical Advice | Payer: BC Managed Care – PPO | Attending: Obstetrics and Gynecology | Admitting: Obstetrics and Gynecology

## 2020-06-28 ENCOUNTER — Other Ambulatory Visit: Payer: Self-pay

## 2020-06-28 DIAGNOSIS — R109 Unspecified abdominal pain: Secondary | ICD-10-CM | POA: Insufficient documentation

## 2020-06-28 DIAGNOSIS — Z3491 Encounter for supervision of normal pregnancy, unspecified, first trimester: Secondary | ICD-10-CM

## 2020-06-28 DIAGNOSIS — O418X10 Other specified disorders of amniotic fluid and membranes, first trimester, not applicable or unspecified: Secondary | ICD-10-CM

## 2020-06-28 DIAGNOSIS — O26891 Other specified pregnancy related conditions, first trimester: Secondary | ICD-10-CM | POA: Insufficient documentation

## 2020-06-28 DIAGNOSIS — O468X1 Other antepartum hemorrhage, first trimester: Secondary | ICD-10-CM | POA: Diagnosis not present

## 2020-06-28 DIAGNOSIS — Z3A01 Less than 8 weeks gestation of pregnancy: Secondary | ICD-10-CM | POA: Insufficient documentation

## 2020-06-28 DIAGNOSIS — O208 Other hemorrhage in early pregnancy: Secondary | ICD-10-CM | POA: Insufficient documentation

## 2020-06-28 LAB — URINALYSIS, ROUTINE W REFLEX MICROSCOPIC
Bilirubin Urine: NEGATIVE
Glucose, UA: NEGATIVE mg/dL
Hgb urine dipstick: NEGATIVE
Ketones, ur: 20 mg/dL — AB
Nitrite: NEGATIVE
Protein, ur: NEGATIVE mg/dL
Specific Gravity, Urine: 1.019 (ref 1.005–1.030)
pH: 5 (ref 5.0–8.0)

## 2020-06-28 LAB — CBC
HCT: 41.3 % (ref 36.0–46.0)
Hemoglobin: 13.9 g/dL (ref 12.0–15.0)
MCH: 28.8 pg (ref 26.0–34.0)
MCHC: 33.7 g/dL (ref 30.0–36.0)
MCV: 85.7 fL (ref 80.0–100.0)
Platelets: 304 10*3/uL (ref 150–400)
RBC: 4.82 MIL/uL (ref 3.87–5.11)
RDW: 11.7 % (ref 11.5–15.5)
WBC: 8.7 10*3/uL (ref 4.0–10.5)
nRBC: 0 % (ref 0.0–0.2)

## 2020-06-28 LAB — WET PREP, GENITAL
Clue Cells Wet Prep HPF POC: NONE SEEN
Sperm: NONE SEEN
Trich, Wet Prep: NONE SEEN
Yeast Wet Prep HPF POC: NONE SEEN

## 2020-06-28 LAB — POCT PREGNANCY, URINE: Preg Test, Ur: POSITIVE — AB

## 2020-06-28 LAB — HCG, QUANTITATIVE, PREGNANCY: hCG, Beta Chain, Quant, S: 9878 m[IU]/mL — ABNORMAL HIGH (ref <5)

## 2020-06-28 LAB — OB RESULTS CONSOLE GC/CHLAMYDIA: Gonorrhea: NEGATIVE

## 2020-06-28 NOTE — MAU Note (Signed)
Deanna Aguirre is a 29 y.o.  here in MAU reporting:  +vaginal bleeding When wipes she notices light pink spotting  +lower abdominal cramping Intermittent Pain score: 1/10  +HPT LMP: sometime around 8/14-16  Onset of complaint: Thursday morning and has continued  Vitals:   06/28/20 1431  BP: 133/78  Pulse: (!) 111  Resp: 19  Temp: 98.3 F (36.8 C)  SpO2: 99%       Lab orders placed from triage: ua and poc  [facility]  [facility].

## 2020-06-28 NOTE — MAU Provider Note (Signed)
History     CSN: [id number]  Arrival date and time: 06/28/20 2011   Chief Complaint  Patient presents with  . Vaginal Bleeding  . Abdominal Pain   HPI Deanna Aguirre is a 29 y.o. [phone] at [redacted]w[redacted]d who presents with spotting and abdominal cramping. She states soon after she had her positive pregnancy test, she started seeing spotting when she wiped. She states it has never been heavier than spotting. She also reports some intermittent abdominal cramping that she rates a 4/10. She has not tried any medication for the pain. She [facility] at [facility].   OB History    Gravida  4   Para  3   Term  3   Preterm  0   AB  0   Living  3     SAB  0   TAB  0   Ectopic  0   Multiple  0   Live Births  3           Past Medical History:  Diagnosis Date  . Medical history non-contributory     Past Surgical History:  Procedure Laterality Date  . NO PAST SURGERIES      Family History  Problem Relation Age of Onset  . Hypertension Mother   . Diabetes Sister   . Breast cancer Maternal Aunt 51  . Cancer Maternal Grandmother        UTERINE  . Cancer Paternal Grandmother        OVARIAN    Social History   Tobacco Use  . Smoking status: Never Smoker  . Smokeless tobacco: Never Used  Substance Use Topics  . Alcohol use: No  . Drug use: No    Allergies: No Known Allergies  No medications prior to admission.    Review of Systems  Constitutional: Negative.  Negative for fatigue and fever.  HENT: Negative.   Respiratory: Negative.  Negative for shortness of breath.   Cardiovascular: Negative.  Negative for chest pain.  Gastrointestinal: Positive for abdominal pain. Negative for constipation, diarrhea, nausea and vomiting.  Genitourinary: Positive for vaginal bleeding. Negative for dysuria and vaginal discharge.  Neurological: Negative.  Negative for dizziness and headaches.   Physical Exam   Last  menstrual period 05/11/2020, unknown if currently breastfeeding.  Physical Exam Vitals and nursing note reviewed.  Constitutional:      General: She is not in acute distress.    Appearance: She is well-developed.  HENT:     Head: Normocephalic.  Eyes:     Pupils: Pupils are equal, round, and reactive to light.  Cardiovascular:     Rate and Rhythm: Normal rate and regular rhythm.     Heart sounds: Normal heart sounds.  Pulmonary:     Effort: Pulmonary effort is normal. No respiratory distress.     Breath sounds: Normal breath sounds.  Abdominal:     General: Bowel sounds are normal. There is no distension.     Palpations: Abdomen is soft.     Tenderness: There is no abdominal tenderness.  Genitourinary:    Vagina: No bleeding.  Skin:    General: Skin is warm and dry.  Neurological:     Mental Status: She is alert and oriented to person, place, and time.  Psychiatric:        Behavior: Behavior normal.        Thought Content: Thought content normal.        Judgment: Judgment  normal.     MAU Course  Procedures Recent Results (from the past 2160 hour(s))  Pregnancy, urine POC     Status: Abnormal   Collection Time: 06/28/20  2:37 PM  Result Value Ref Range   Preg Test, Ur POSITIVE (A) NEGATIVE    Comment:        THE SENSITIVITY OF THIS METHODOLOGY IS >24 mIU/mL   Urinalysis, Routine w reflex microscopic Urine, Clean Catch     Status: Abnormal   Collection Time: 06/28/20  2:38 PM  Result Value Ref Range   Color, Urine YELLOW YELLOW   APPearance HAZY (A) CLEAR   Specific Gravity, Urine 1.019 1.005 - 1.030   pH 5.0 5.0 - 8.0   Glucose, UA NEGATIVE NEGATIVE mg/dL   Hgb urine dipstick NEGATIVE NEGATIVE   Bilirubin Urine NEGATIVE NEGATIVE   Ketones, ur 20 (A) NEGATIVE mg/dL   Protein, ur NEGATIVE NEGATIVE mg/dL   Nitrite NEGATIVE NEGATIVE   Leukocytes,Ua LARGE (A) NEGATIVE   RBC / HPF 0-5 0 - 5 RBC/hpf   WBC, UA 11-20 0 - 5 WBC/hpf   Bacteria, UA FEW (A) NONE SEEN    Squamous Epithelial / LPF 0-5 0 - 5   Mucus PRESENT     Comment: Performed at [facility], [address], [place], [place] [address]  CBC     Status: None   Collection Time: 06/28/20  3:36 PM  Result Value Ref Range   WBC 8.7 4.0 - 10.5 K/uL   RBC 4.82 3.87 - 5.11 MIL/uL   Hemoglobin 13.9 12.0 - 15.0 g/dL   HCT 23.7 36 - 46 %   MCV 85.7 80.0 - 100.0 fL   MCH 28.8 26.0 - 34.0 pg   MCHC 33.7 30.0 - 36.0 g/dL   RDW 62.8 31.5 - 17.6 %   Platelets 304 150 - 400 K/uL   nRBC 0.0 0.0 - 0.2 %    Comment: Performed at [facility], [address] [place], [place] [address]  hCG, quantitative, pregnancy     Status: Abnormal   Collection Time: 06/28/20  3:36 PM  Result Value Ref Range   hCG, Beta Chain, Quant, S 9,878 (H) <5 mIU/mL    Comment:          GEST. AGE      CONC.  (mIU/mL)   <=1 WEEK        5 - 50     2 WEEKS       50 - 500     3 WEEKS       100 - 10,000     4 WEEKS     1,000 - 30,000     5 WEEKS     3,500 - 115,000   6-8 WEEKS     12,000 - 270,000    12 WEEKS     15,000 - 220,000        FEMALE AND NON-PREGNANT FEMALE:     LESS THAN 5 mIU/mL Performed at [facility], [address] [place], [place] [address]   Wet prep, genital     Status: Abnormal   Collection Time: 06/28/20  9:37 PM   Specimen: PATH Cytology Cervicovaginal Ancillary Only  Result Value Ref Range   Yeast Wet Prep HPF POC NONE SEEN NONE SEEN   Trich, Wet Prep NONE SEEN NONE SEEN   Clue Cells Wet Prep HPF POC NONE SEEN NONE SEEN   WBC, Wet Prep HPF POC  MANY (A) NONE SEEN   Sperm NONE SEEN     Comment: Performed at [facility], [address] [place], [place] [address]   US OB LESS THAN 14 WEEKS WITH [place] TRANSVAGINAL  Result Date: 06/28/2020 CLINICAL DATA:  Cramping and spotting. EXAM: OBSTETRIC <14 WK [place] AND TRANSVAGINAL OB US TECHNIQUE: Both transabdominal and transvaginal ultrasound examinations were performed for complete evaluation of the gestation as well as the maternal  uterus, adnexal regions, and pelvic cul-de-sac. Transvaginal technique was performed to assess early pregnancy. COMPARISON:  None. FINDINGS: Intrauterine gestational sac: Single Yolk sac:  Visualized. Embryo:  Visualized. Cardiac Activity: Visualized. Heart Rate: 132 bpm CRL:  6.2 mm   6 w   3 d                  [place] [facility]: Feb 18, 2021 Subchorionic hemorrhage:  A small subchorionic hemorrhage is seen. Maternal uterus/adnexae: The bilateral ovaries are visualized and are normal in appearance. There is no evidence of free fluid. IMPRESSION: 1. Single, viable intrauterine pregnancy at approximately 6 weeks and 3 days gestation by ultrasound evaluation. 2. Small subchorionic hemorrhage. Electronically Signed   By: Aram Candela M.D.   On: 06/28/2020 21:48   MDM UA, UPT CBC, HCG ABO/Rh- O Pos Wet prep and gc/chlamydia US OB Comp Less 14 weeks with Transvaginal  Reviewed results with patient and bleeding precautions related to subchorionic hemorrhages at length with patient.   Assessment and Plan   1. Normal intrauterine pregnancy on prenatal ultrasound in first trimester   2. Abdominal pain affecting pregnancy   3. [redacted] weeks gestation of pregnancy   4. Subchorionic hemorrhage of placenta in first trimester, single or unspecified fetus    -Discharge home in stable condition -Vaginal bleeding precautions discussed -Patient advised to follow-up with OB as scheduled to establish prenatal care -Patient may return to MAU as needed or if her condition were to change or worsen   Rolm Bookbinder CNM 06/28/2020, 11:42 PM

## 2020-06-28 NOTE — Discharge Instructions (Signed)
Subchorionic Hematoma  A subchorionic hematoma is a gathering of blood between the outer wall of the embryo (chorion) and the inner wall of the womb (uterus). This condition can cause vaginal bleeding. If they cause little or no vaginal bleeding, early small hematomas usually shrink on their own and do not affect your baby or pregnancy. When bleeding starts later in pregnancy, or if the hematoma is larger or occurs in older pregnant women, the condition may be more serious. Larger hematomas may get bigger, which increases the chances of miscarriage. This condition also increases the risk of:  Premature separation of the placenta from the uterus.  Premature (preterm) labor.  Stillbirth. What are the causes? The exact cause of this condition is not known. It occurs when blood is trapped between the placenta and the uterine wall because the placenta has separated from the original site of implantation. What increases the risk? You are more likely to develop this condition if:  You were treated with fertility medicines.  You conceived through in vitro fertilization (IVF). What are the signs or symptoms? Symptoms of this condition include:  Vaginal spotting or bleeding.  Contractions of the uterus. These cause abdominal pain. Sometimes you may have no symptoms and the bleeding may only be seen when ultrasound images are taken (transvaginal ultrasound). How is this diagnosed? This condition is diagnosed based on a physical exam. This includes a pelvic exam. You may also have other tests, including:  Blood tests.  Urine tests.  Ultrasound of the abdomen. How is this treated? Treatment for this condition can vary. Treatment may include:  Watchful waiting. You will be monitored closely for any changes in bleeding. During this stage: ? The hematoma may be reabsorbed by the body. ? The hematoma may separate the fluid-filled space containing the embryo (gestational sac) from the wall of the  womb (endometrium).  Medicines.  Activity restriction. This may be needed until the bleeding stops. Follow these instructions at home:  Stay on bed rest if told to do so by your health care provider.  Do not lift anything that is heavier than 10 lbs. (4.5 kg) or as told by your health care provider.  Do not use any products that contain nicotine or tobacco, such as cigarettes and e-cigarettes. If you need help quitting, ask your health care provider.  Track and write down the number of pads you use each day and how soaked (saturated) they are.  Do not use tampons.  Keep all follow-up visits as told by your health care provider. This is important. Your health care provider may ask you to have follow-up blood tests or ultrasound tests or both. Contact a health care provider if:  You have any vaginal bleeding.  You have a fever. Get help right away if:  You have severe cramps in your stomach, back, abdomen, or pelvis.  You pass large clots or tissue. Save any tissue for your health care provider to look at.  You have more vaginal bleeding, and you faint or become lightheaded or weak. Summary  A subchorionic hematoma is a gathering of blood between the outer wall of the placenta and the uterus.  This condition can cause vaginal bleeding.  Sometimes you may have no symptoms and the bleeding may only be seen when ultrasound images are taken.  Treatment may include watchful waiting, medicines, or activity restriction. This information is not intended to replace advice given to you by your health care provider. Make sure you discuss any questions you   have with your health care provider. Document Revised: 08/25/2017 Document Reviewed: 11/08/2016 Elsevier Patient Education  2020 [employer]                        Safe Medications in Pregnancy    Acne: Benzoyl Peroxide Salicylic Acid  Backache/Headache: Tylenol: 2 regular strength every 4 hours OR              2 Extra  strength every 6 hours  Colds/Coughs/Allergies: Benadryl (alcohol free) 25 mg every 6 hours as needed Breath right strips Claritin Cepacol throat lozenges Chloraseptic throat spray Cold-Eeze- up to three times per day Cough drops, alcohol free Flonase (by prescription only) Guaifenesin Mucinex Robitussin DM (plain only, alcohol free) Saline nasal spray/drops Sudafed (pseudoephedrine) & Actifed ** use only after [redacted] weeks gestation and if you do not have high blood pressure Tylenol Vicks Vaporub Zinc lozenges Zyrtec   Constipation: Colace Ducolax suppositories Fleet enema Glycerin suppositories Metamucil Milk of magnesia Miralax Senokot Smooth move tea  Diarrhea: Kaopectate Imodium A-D  *NO pepto Bismol  Hemorrhoids: Anusol Anusol HC Preparation H Tucks  Indigestion: Tums Maalox Mylanta Zantac  Pepcid  Insomnia: Benadryl (alcohol free) 25mg every 6 hours as needed Tylenol PM Unisom, no Gelcaps  Leg Cramps: Tums MagGel  Nausea/Vomiting:  Bonine Dramamine Emetrol Ginger extract Sea bands Meclizine  Nausea medication to take during pregnancy:  Unisom (doxylamine succinate 25 mg tablets) Take one tablet daily at bedtime. If symptoms are not adequately controlled, the dose can be increased to a maximum recommended dose of two tablets daily (1/2 tablet in the morning, 1/2 tablet mid-afternoon and one at bedtime). Vitamin B6 100mg tablets. Take one tablet twice a day (up to 200 mg per day).  Skin Rashes: Aveeno products Benadryl cream or 25mg every 6 hours as needed Calamine Lotion 1% cortisone cream  Yeast infection: Gyne-lotrimin 7 Monistat 7   **If taking multiple medications, please check labels to avoid duplicating the same active ingredients **take medication as directed on the label ** Do not exceed 4000 mg of tylenol in 24 hours **Do not take medications that contain aspirin or ibuprofen           

## 2020-06-28 NOTE — MAU Note (Signed)
Pt had to leave AMA to attend daughter's birthday party. Provider did MSE and patient said she'd come back tonight around 2000.

## 2020-06-29 LAB — GC/CHLAMYDIA PROBE AMP (~~LOC~~) NOT AT ARMC
Chlamydia: NEGATIVE
Comment: NEGATIVE
Comment: NORMAL
Neisseria Gonorrhea: NEGATIVE

## 2020-07-07 LAB — OB RESULTS CONSOLE HIV ANTIBODY (ROUTINE TESTING): HIV: NONREACTIVE

## 2020-07-07 LAB — OB RESULTS CONSOLE HEPATITIS B SURFACE ANTIGEN: Hepatitis B Surface Ag: NEGATIVE

## 2020-07-07 LAB — OB RESULTS CONSOLE RUBELLA ANTIBODY, IGM: Rubella: IMMUNE

## 2020-07-07 LAB — OB RESULTS CONSOLE RPR: RPR: NONREACTIVE

## 2020-12-23 ENCOUNTER — Inpatient Hospital Stay (HOSPITAL_COMMUNITY)
Admission: AD | Admit: 2020-12-23 | Discharge: 2021-01-08 | DRG: 788 | Disposition: A | Discharge status: Home / Self Care | Payer: BC Managed Care – PPO | Attending: Obstetrics and Gynecology | Admitting: Obstetrics and Gynecology

## 2020-12-23 ENCOUNTER — Encounter (HOSPITAL_COMMUNITY): Payer: Self-pay | Admitting: Obstetrics and Gynecology

## 2020-12-23 ENCOUNTER — Other Ambulatory Visit: Payer: Self-pay

## 2020-12-23 DIAGNOSIS — O321XX0 Maternal care for breech presentation, not applicable or unspecified: Secondary | ICD-10-CM | POA: Diagnosis present

## 2020-12-23 DIAGNOSIS — O4393 Unspecified placental disorder, third trimester: Secondary | ICD-10-CM | POA: Diagnosis not present

## 2020-12-23 DIAGNOSIS — O26853 Spotting complicating pregnancy, third trimester: Secondary | ICD-10-CM | POA: Diagnosis present

## 2020-12-23 DIAGNOSIS — O322XX0 Maternal care for transverse and oblique lie, not applicable or unspecified: Secondary | ICD-10-CM | POA: Diagnosis not present

## 2020-12-23 DIAGNOSIS — Z3A33 33 weeks gestation of pregnancy: Secondary | ICD-10-CM | POA: Diagnosis not present

## 2020-12-23 DIAGNOSIS — O42913 Preterm premature rupture of membranes, unspecified as to length of time between rupture and onset of labor, third trimester: Secondary | ICD-10-CM | POA: Diagnosis present

## 2020-12-23 DIAGNOSIS — Z8759 Personal history of other complications of pregnancy, childbirth and the puerperium: Secondary | ICD-10-CM | POA: Diagnosis not present

## 2020-12-23 DIAGNOSIS — Z20822 Contact with and (suspected) exposure to covid-19: Secondary | ICD-10-CM | POA: Diagnosis present

## 2020-12-23 DIAGNOSIS — Z3A32 32 weeks gestation of pregnancy: Secondary | ICD-10-CM

## 2020-12-23 DIAGNOSIS — O42919 Preterm premature rupture of membranes, unspecified as to length of time between rupture and onset of labor, unspecified trimester: Secondary | ICD-10-CM

## 2020-12-23 LAB — CBC
HCT: 37 % (ref 36.0–46.0)
Hemoglobin: 12 g/dL (ref 12.0–15.0)
MCH: 27.8 pg (ref 26.0–34.0)
MCHC: 32.4 g/dL (ref 30.0–36.0)
MCV: 85.6 fL (ref 80.0–100.0)
Platelets: 220 10*3/uL (ref 150–400)
RBC: 4.32 MIL/uL (ref 3.87–5.11)
RDW: 12.9 % (ref 11.5–15.5)
WBC: 7.5 10*3/uL (ref 4.0–10.5)
nRBC: 0 % (ref 0.0–0.2)

## 2020-12-23 LAB — TYPE AND SCREEN
ABO/RH(D): O POS
Antibody Screen: NEGATIVE

## 2020-12-23 LAB — RESP PANEL BY RT-PCR (FLU A&B, COVID) ARPGX2
Influenza A by PCR: NEGATIVE
Influenza B by PCR: NEGATIVE
SARS Coronavirus 2 by RT PCR: NEGATIVE

## 2020-12-23 MED ORDER — ZOLPIDEM TARTRATE 5 MG PO TABS: 5.0000 mg | ORAL_TABLET | Freq: Every evening | ORAL | Status: DC | PRN

## 2020-12-23 MED ORDER — BETAMETHASONE SOD PHOS & ACET 6 (3-3) MG/ML IJ SUSP
12.0000 mg | INTRAMUSCULAR | Status: AC
  Administered 2020-12-23 – 2020-12-24 (×2): 12 mg via INTRAMUSCULAR
  Filled 2020-12-23 (×2): qty 5

## 2020-12-23 MED ORDER — SODIUM CHLORIDE 0.9 % IV SOLN
250.0000 mg | Freq: Four times a day (QID) | INTRAVENOUS | Status: AC
  Administered 2020-12-23 – 2020-12-25 (×8): 250 mg via INTRAVENOUS
  Filled 2020-12-23 (×14): qty 5

## 2020-12-23 MED ORDER — ACETAMINOPHEN 325 MG PO TABS
650.0000 mg | ORAL_TABLET | ORAL | Status: DC | PRN
  Administered 2020-12-23: 650 mg via ORAL
  Filled 2020-12-23: qty 2

## 2020-12-23 MED ORDER — CALCIUM CARBONATE ANTACID 500 MG PO CHEW: 2.0000 | CHEWABLE_TABLET | ORAL | Status: DC | PRN

## 2020-12-23 MED ORDER — DOCUSATE SODIUM 100 MG PO CAPS
100.0000 mg | ORAL_CAPSULE | Freq: Every day | ORAL | Status: DC
  Administered 2020-12-24 – 2021-01-05 (×13): 100 mg via ORAL
  Filled 2020-12-23 (×13): qty 1

## 2020-12-23 MED ORDER — PRENATAL MULTIVITAMIN CH
1.0000 | ORAL_TABLET | Freq: Every day | ORAL | Status: DC
  Administered 2020-12-24 – 2021-01-05 (×12): 1 via ORAL
  Filled 2020-12-23 (×12): qty 1

## 2020-12-23 MED ORDER — SODIUM CHLORIDE 0.9 % IV SOLN
2.0000 g | Freq: Four times a day (QID) | INTRAVENOUS | Status: AC
  Administered 2020-12-23 – 2020-12-25 (×8): 2 g via INTRAVENOUS
  Filled 2020-12-23 (×8): qty 2000

## 2020-12-23 NOTE — H&P (Signed)
Deanna Aguirre is a  30 year old G 4 P 3003 at 69 w 2 days presents with complaints of spotting with urination starting at 11 pm last night.  Came to the office this morning,  Large pool - nitrazine positive  Ultrasound AFI 7.8 and baby in transverse position  Admitted for management of PPROM.  OB History    Gravida  4   Para  3   Term  3   Preterm  0   AB  0   Living  3     SAB  0   IAB  0   Ectopic  0   Multiple  0   Live Births  3          Past Medical History:  Diagnosis Date  . Medical history non-contributory    Past Surgical History:  Procedure Laterality Date  . NO PAST SURGERIES     Family History: family history includes Asthma in her sister; Breast cancer (age of onset: 79) in her maternal aunt; Cancer in her maternal grandmother and paternal grandmother; Diabetes in her sister; Hypertension in her mother. Social History:  reports that she has never smoked. She has never used smokeless tobacco. She reports that she does not drink alcohol and does not use drugs.     Maternal Diabetes: No Genetic Screening: Normal Maternal Ultrasounds/Referrals: Normal Fetal Ultrasounds or other Referrals:  None Maternal Substance Abuse:  No Significant Maternal Medications:  None Significant Maternal Lab Results:  None Other Comments:  None  Review of Systems  All other systems reviewed and are negative.  Maternal Medical History:  Reason for admission: Rupture of membranes.       Blood pressure 120/72, pulse 90, temperature 97.6 F (36.4 C), temperature source Oral, resp. rate 18, height 5\' 3"  (1.6 m), weight 78 kg, last menstrual period 05/11/2020, unknown if currently breastfeeding. Exam Physical Exam Vitals and nursing note reviewed. Exam conducted with a chaperone present.  Constitutional:      Appearance: Normal appearance.  HENT:     Head: Normocephalic.  Eyes:     Pupils: Pupils are equal, round, and reactive to light.  Cardiovascular:      Rate and Rhythm: Regular rhythm. Tachycardia present.  Pulmonary:     Effort: Pulmonary effort is normal.  Abdominal:     General: Abdomen is flat.  Musculoskeletal:     Cervical back: Normal range of motion.  Neurological:     Mental Status: She is alert.     Prenatal labs: ABO, Rh:   Antibody:   Rubella:   RPR:    HBsAg:    HIV:    GBS:     Assessment/Plan: IUP at 32 w 2 days PPROM  Admit to OB High Risk  Plan: Latency antibiotics Steroids Tocolysis if contractions occur Monitor for chorioamnionitis NICU and MFM consultation Deliver if doing well at 34 weeks  Spent 30 minutes with patient and her spouse discussing plan of care  05/13/2020 12/23/2020, 12:59 PM

## 2020-12-23 NOTE — Consult Note (Signed)
[facility] 12/23/2020    10:04 PM  Neonatal Medicine Consultation         [name]          MRN:  [id number]  I was called at the request of the patient's obstetrician (Dr. Vincente Poli) to speak to this patient due to likely preterm birth following PPROM.  The patient's prenatal course has been uncomplicated until late yesterday when she developed spotting.  She presented to her OB's office this morning where ROM was confirmed.  She is [redacted]w[redacted]d weeks currently.  She is admitted to [facility], and is receiving treatment that includes betamethasone course and latency antibiotics (ampicillin and erythromycin).  The baby is a female, positioned transverse.  I reviewed issues for a baby born at 32+ weeks, including survival, length of stay, respiratory distress, infection, feeding difficulty.  I described how we provide respiratory and feeding support.  Mom does not plan to breast feed (she has 3 girls and has tried breast feeding before). I reviewed how our neonatal team will be called to the delivery and provide support as needed before transporting by isolette to the NICU.  I spent 20 minutes reviewing the record, speaking to the patient, and entering appropriate documentation.  More than 50% of the time was spent face to face with patient.   _____________________ Angelita Ingles, MD Attending Neonatologist

## 2020-12-23 NOTE — Progress Notes (Signed)
Ob notes Latency antibiotics started  Iv ampicillin and iv erythromycin for 48 hours and if undelivered after that  Recommend amoxicillin and po erythromycin

## 2020-12-24 DIAGNOSIS — Z8759 Personal history of other complications of pregnancy, childbirth and the puerperium: Secondary | ICD-10-CM

## 2020-12-24 MED ORDER — SODIUM CHLORIDE 0.9 % IV SOLN
INTRAVENOUS | Status: DC | PRN
  Administered 2020-12-24: 250 mL via INTRAVENOUS

## 2020-12-24 NOTE — Progress Notes (Signed)
S: Doing well. She denies contractions and bleeding. Continues to leak clear fluid. +FM. Will miss her oldest daughters cheer competition for the first time.   O: Today's Vitals   12/24/20 0035 12/24/20 0500 12/24/20 0739 12/24/20 0800  BP: (!) 102/58  105/68   Pulse: 72  75   Resp: 16  18   Temp: 98 F (36.7 C)  97.8 F (36.6 C)   TempSrc: Oral  Oral   SpO2: 97%  98%   Weight:  79.6 kg    Height:      PainSc:    0-No pain   Body mass index is 31.09 kg/m. NAD, A&O NWOB Abd soft, nondistended, gravid SVE deferred, clear fluid on pad  A/P: 29yo [id number] @ 32.3 wga who presented at 32.2 wga with PPROM   # PPROM - counseled regarding risks of expectant management including abruption, infection, and IUFD. Discussed indications for delivery including infection, labor, abruption or non-reassuring fetal status - Delivery at 34 wga - BMZ for FLM - Latency abx - MFM consult ordered, they plan on an [place] - defer Mag for CP proph given > 32 wga - No s/s of abruption, infection, or labor. Cervix closed on admission.   # MWB - doing well  # FWB - NST TID - s/p NIC consult - BMZ #2 today  # GBS - sent and pending  # ROD - undetermined. Last [place] yesterday showed transverse lie.   # Dispo: AP  [name] MD

## 2020-12-24 NOTE — Consult Note (Addendum)
MFM consultation  Date of service: 12/24/20 Reason for request: Premature rupture of membranes Requesting provider: Marcelle Overlie, MD   Ms. Noori is a 30 yo G4P3 at 48 w 3 d consistent with L/7 weeks. She is seen for premature rupture of membranes at the request of Dr. Vincente Poli.  She is overall doing well without complaints. Her pregnancy is uncomplicated.   Ms. Drozdowski notes that two days ago she notice blood tinged fluid after going to the restroom. Then yesterday noticed again and was evaluated and noted to have pooling. She denied uterine contractions or vaginal bleeding.   She has received BMZ and IV antibiotics. She has not required tocolysis.  She denies fever, uterine tenderness or tachycardia.  Vitals with BMI 12/24/2020 12/24/2020 12/24/2020  Height - - -  Weight - 175 lbs 8 oz -  BMI - 31.1 -  Systolic 105 - 102  Diastolic 68 - 58  Pulse 75 - 72   CBC Latest Ref Rng & Units 12/23/2020 06/28/2020 01/09/2020  WBC 4.0 - 10.5 K/uL 7.5 8.7 10.6(H)  Hemoglobin 12.0 - 15.0 g/dL 59.9 35.7 01.7  Hematocrit 36.0 - 46.0 % 37.0 41.3 42.1  Platelets 150 - 400 K/uL 220 304 334   OB History  Gravida Para Term Preterm AB Living  4 3 3  0 0 3  SAB IAB Ectopic Multiple Live Births  0 0 0 0 3    # Outcome Date GA Lbr Len/2nd Weight Sex Delivery Anes PTL Lv  4 Current           3 Term 09/16/17 [redacted]w[redacted]d 05:57 / 00:10 3325 g F Vag-Spont EPI  LIV     Birth Comments: WDL     Name: Mayorquin,GIRL Keelynn     Apgar1: 9  Apgar5: 10  2 Term 06/29/15 [redacted]w[redacted]d 13:39 / 00:22 3861 g F Vag-Spont EPI  LIV     Name: Slevin,GIRL Kaylenn     Apgar1: 9  Apgar5: 9  1 Term     F Vag-Spont  N LIV   Past Surgical History:  Procedure Laterality Date  . NO PAST SURGERIES     Family History  Problem Relation Age of Onset  . Hypertension Mother   . Diabetes Sister   . Breast cancer Maternal Aunt 51  . Cancer Maternal Grandmother        UTERINE  . Cancer Paternal Grandmother        OVARIAN  . Asthma Sister     Social History   Socioeconomic History  . Marital status: Married    Spouse name: Not on file  . Number of children: Not on file  . Years of education: Not on file  . Highest education level: Not on file  Occupational History  . Not on file  Tobacco Use  . Smoking status: Never Smoker  . Smokeless tobacco: Never Used  Vaping Use  . Vaping Use: Never used  Substance and Sexual Activity  . Alcohol use: No  . Drug use: No  . Sexual activity: Yes    Birth control/protection: None  Other Topics Concern  . Not on file  Social History Narrative   ** Merged History Encounter **       Social Determinants of Health   Financial Resource Strain: Not on file  Food Insecurity: Not on file  Transportation Needs: Not on file  Physical Activity: Not on file  Stress: Not on file  Social Connections: Not on file  Intimate Partner Violence: Not on  file    Current Facility-Administered Medications (Endocrine & Metabolic):  .  betamethasone acetate-betamethasone sodium phosphate (CELESTONE) injection 12 mg       Current Facility-Administered Medications (Analgesics):  .  acetaminophen (TYLENOL) tablet 650 mg     Current Facility-Administered Medications (Other):  .  ampicillin (OMNIPEN) 2 g in sodium chloride 0.9 % 100 mL IVPB .  calcium carbonate (TUMS - dosed in mg elemental calcium) chewable tablet 400 mg of elemental calcium .  docusate sodium (COLACE) capsule 100 mg .  erythromycin 250 mg in sodium chloride 0.9 % 100 mL IVPB .  prenatal multivitamin tablet 1 tablet .  zolpidem (AMBIEN) tablet 5 mg  No current outpatient medications on file.  Impression/Counseling:  I discussed with Ms. Olivos the diagnosis, evaluation and management of premature rupture of membranes.  We discussed the risk of expectant management vs early delivery at 34 weeks. To include infection, hemorrhage and cesarean delivery. There are studies in particular methanolysis showing that expectant  management vs early delivery may improve outcomes. Included a reduced risk for RDS, need for mechanical ventilation, NICU admission and neonatal death (RR 1.26, 1.27, 1.16 and 2.55 respectively). Where as a planned early delivery reduces the risk of chorioamnionitis, LOS, there is an increased risk of C/S and endometritis.   However, given good dates and BMZ administration delivery at 34 weeks is reasonable and is preferred. But if Ms. Maske desired, and she is stable, expectant management until 37 weeks if supported by ACOG.  Finally, she is doing well and is on Abx and received her second dose of BMZ today.  As long as she remains afebrile, without tachycardia  (maternal or fetal). Delivery between 34-37 is recommended.   Since she has not received a growth exam since January it is reasonable to repeat growth while hospitalized.  I spent 40 minutes with > 50% in face to face consultation.  Novella Olive, MD.  All questions answered.

## 2020-12-25 ENCOUNTER — Inpatient Hospital Stay (HOSPITAL_BASED_OUTPATIENT_CLINIC_OR_DEPARTMENT_OTHER): Payer: BC Managed Care – PPO

## 2020-12-25 DIAGNOSIS — Z3A32 32 weeks gestation of pregnancy: Secondary | ICD-10-CM | POA: Diagnosis not present

## 2020-12-25 DIAGNOSIS — O42913 Preterm premature rupture of membranes, unspecified as to length of time between rupture and onset of labor, third trimester: Secondary | ICD-10-CM | POA: Diagnosis not present

## 2020-12-25 LAB — CULTURE, BETA STREP (GROUP B ONLY)

## 2020-12-25 MED ORDER — ERYTHROMYCIN ETHYLSUCCINATE 400 MG/5ML PO SUSR
333.0000 mg | Freq: Three times a day (TID) | ORAL | Status: AC
  Administered 2020-12-25 – 2020-12-30 (×14): 333 mg via ORAL
  Filled 2020-12-25 (×15): qty 4.2

## 2020-12-25 MED ORDER — AMOXICILLIN 250 MG PO CAPS
250.0000 mg | ORAL_CAPSULE | Freq: Three times a day (TID) | ORAL | Status: AC
  Administered 2020-12-25 – 2020-12-30 (×15): 250 mg via ORAL
  Filled 2020-12-25 (×15): qty 1

## 2020-12-25 NOTE — Progress Notes (Signed)
No c/o.  No CTX, abdominal pain or VB.  Active FM.  Vitals:   12/25/20 0534 12/25/20 0725  BP: (!) 94/52 (!) 100/54  Pulse: 71 74  Resp: 16 16  Temp: 98.3 F (36.8 C) 98.1 F (36.7 C)  SpO2: 99% 99%    CBC Latest Ref Rng & Units 12/23/2020 06/28/2020 01/09/2020  WBC 4.0 - 10.5 K/uL 7.5 8.7 10.6(H)  Hemoglobin 12.0 - 15.0 g/dL 17.4 94.4 96.7  Hematocrit 36.0 - 46.0 % 37.0 41.3 42.1  Platelets 150 - 400 K/uL 220 304 334   Growth u/s this AM with report pending; breech on review of images  Gen: A&O x 3 Abd: soft, NT Ext: no c/c/e  29yo G4P3003 at [redacted]w[redacted]d with PPROM on 3/30 -S/P BMZ -D3 latency abx -NST TID -F/U growth u/s done today -S/P MFM consult -S/P NICU consult -CTM for signes of labor, abruption, chorio; otherwise deliver at 34 weeks -Breech presentation-patient counseled that this is unlikely to change given PPROM.  She is counseled that if breech when delivery is indicated, will proceed with C/S.  She is counseled re: risk of bleeding, infection, scarring and damage to surrounding structures.   -GBS cx pending  Mitchel Honour, DO

## 2020-12-26 NOTE — Progress Notes (Signed)
No c/o.  Rare LOF.  No VB.  Active FM.  No subjective fever or chills.    Vitals:   12/25/20 1945 12/26/20 0550  BP: (!) 100/46 100/64  Pulse: 69 76  Resp: 18 18  Temp: 98 F (36.7 C) 98.1 F (36.7 C)  SpO2: 99% 99%    NST reactive U/S yesterday BPP 8/8 (AFI 11).  Breech.  Gen: A&O x 3 Abd: soft, NT Ext: no c/c/e  29yo G4P3003 at [redacted]w[redacted]d with PPROM on 3/30 -S/P BMZ -D4 latency abx -NST TID -Weekly BPP (next due Friday) -S/P MFM consult -S/P NICU consult -CTM for signes of labor, abruption, chorio; otherwise deliver at 34 weeks -Breech presentation-patient counseled that this is unlikely to change given PPROM.  She is counseled that if breech when delivery is indicated, will proceed with C/S.  She is counseled re: risk of bleeding, infection, scarring and damage to surrounding structures.   -GBS negative  Mitchel Honour, DO

## 2020-12-27 LAB — TYPE AND SCREEN
ABO/RH(D): O POS
Antibody Screen: NEGATIVE

## 2020-12-27 LAB — CBC
HCT: 33.7 % — ABNORMAL LOW (ref 36.0–46.0)
Hemoglobin: 11.1 g/dL — ABNORMAL LOW (ref 12.0–15.0)
MCH: 28.6 pg (ref 26.0–34.0)
MCHC: 32.9 g/dL (ref 30.0–36.0)
MCV: 86.9 fL (ref 80.0–100.0)
Platelets: 218 10*3/uL (ref 150–400)
RBC: 3.88 MIL/uL (ref 3.87–5.11)
RDW: 13.3 % (ref 11.5–15.5)
WBC: 8.5 10*3/uL (ref 4.0–10.5)
nRBC: 0.2 % (ref 0.0–0.2)

## 2020-12-27 NOTE — Progress Notes (Signed)
Reports tiny amount of blood when voiding; in toilet only and not visible on tissue paper with wiping.  Active FM.  Mild cramping which feels like gas.  No fever, chills.  No abdominal pain.  Vitals:   12/27/20 0357 12/27/20 0741  BP: (!) 97/42 (!) 97/41  Pulse: 66 74  Resp: 17 18  Temp: 97.8 F (36.6 C) 97.9 F (36.6 C)  SpO2: 99% 99%   CBC Latest Ref Rng & Units 12/27/2020 12/23/2020 06/28/2020  WBC 4.0 - 10.5 K/uL 8.5 7.5 8.7  Hemoglobin 12.0 - 15.0 g/dL 11.1(L) 12.0 13.9  Hematocrit 36.0 - 46.0 % 33.7(L) 37.0 41.3  Platelets 150 - 400 K/uL 218 220 304   NST reactive  Gen: A&O x 3 Abd: soft, NT Ext: no c/c/e  29yo G4P3003 at [redacted]w[redacted]d with PPROM on 3/30 (EDC changed in Epic due to inaccuracy) -S/P BMZ -D5 latency abx -NST TID -Weekly BPP (next due Friday) -S/P MFM consult -S/P NICU consult -CTM for signes of labor, abruption, chorio; otherwise deliver at 34 weeks -Breech presentation-patient counseled that this is unlikely to change given PPROM. She is counseled that if breech when delivery is indicated, will proceed with C/S. She is counseled re: risk of bleeding, infection, scarring and damage to surrounding structures.  -GBS negative  Mitchel Honour, DO

## 2020-12-28 NOTE — Progress Notes (Signed)
Antepartum Progress Note  [name] is a 30 y.o. [phone] at [redacted]w[redacted]d admitted for PPROM, now hospital day 6. Overnight patient reports no issues.  She denies cramping, denies bleeding, denies contractions. She reports good fetal movement. She reports LOF yesterday morning. She denies fever, chills, SOB, CP, HA.    Vitals:   12/27/20 2203 12/28/20 0630  BP: 103/64 (!) 95/55  Pulse: 82 65  Resp: 16 18  Temp: 98.7 F (37.1 C) 97.9 F (36.6 C)  SpO2: 98% 99%   CBC Latest Ref Rng & Units 12/27/2020 12/23/2020 06/28/2020  WBC 4.0 - 10.5 K/uL 8.5 7.5 8.7  Hemoglobin 12.0 - 15.0 g/dL 11.1(L) 12.0 13.9  Hematocrit 36.0 - 46.0 % 33.7(L) 37.0 41.3  Platelets 150 - 400 K/uL 218 220 304   NST reactive  Gen: A&O x 3 Abd: soft, NT Chest: nonlabored breathing Abdomen: soft, nondistended nontender. Ext: no signs of DVT  29yo G4P3003 at [redacted]w[redacted]d with PPROM on 3/30 (EDC changed in Epic due to inaccuracy) -S/P BMZ -D6 latency abx -NST TID -Weekly BPP (next due Friday) -S/P MFM consult -S/P NICU consult -CTM for signs of labor, abruption, chorio; otherwise deliver at 34 weeks. -Breech presentation-patient counseled that this is unlikely to change given PPROM. She is counseled that if breech when delivery is indicated, will proceed with C/S. She is counseled re: risk of bleeding, infection, scarring and damage to surrounding structures.  -GBS negative  Nilda Simmer MD

## 2020-12-28 NOTE — Progress Notes (Signed)
Initial visit with [facility] to introduce spiritual care services and offer support during her hospital stay.  Chaplain asked open ended questions to facilitate story telling. Pt shared that this is her first boy and her first difficult pregnancy. She has three daughters aged 32, 31, and 3. Shaya is expected to be in the hospital for a while as she awaits Cohen's birth and is grieving some of what she is missing at home. This weekend she had to miss her sister's wedding and her daughters' cheerleading competition. The family had a trip planned for later this month for another big competition for daughter Ava and Beonka is working to find peace in the unknown of what things will be like in just a few short weeks. Chaplain noramlized the duality of feeling grateful in knowing that you're where you need to be for baby's safety while still grieving the things you're missing at home and that the grief is complicated by not feeling badly, but still needing to remain in the hospital. Aletheia reports she has good support, but is struggling to not be able to see her children.  Please page as further needs arise.  [name]. [name], M.Div. [facility] Chaplain Pager [phone] Office [phone]

## 2020-12-29 ENCOUNTER — Encounter (HOSPITAL_COMMUNITY): Payer: Self-pay | Admitting: Obstetrics and Gynecology

## 2020-12-29 NOTE — Plan of Care (Signed)
  Problem: Clinical Measurements: Goal: Ability to maintain clinical measurements within normal limits will improve Outcome: Progressing Goal: Will remain free from infection Outcome: Progressing   Problem: Activity: Goal: Risk for activity intolerance will decrease Outcome: Progressing   Problem: Pain Managment: Goal: General experience of comfort will improve Outcome: Progressing   Problem: Education: Goal: Knowledge of disease or condition will improve Outcome: Progressing Goal: Knowledge of the prescribed therapeutic regimen will improve Outcome: Progressing   Problem: Clinical Measurements: Goal: Complications related to the disease process, condition or treatment will be avoided or minimized Outcome: Progressing

## 2020-12-29 NOTE — Progress Notes (Signed)
Antepartum Progress Note  Deanna Aguirre is a 30 y.o. [phone] at [redacted]w[redacted]d admitted for PPROM, now hospital day 7. Reports feeling well. Denies vaginal bleeding, cramping, and contractions. Denies fever or chills. +FM. Not really leaking fluid currently. Is wanting delivery at 8 wga or a few days alter.     Vitals:   12/28/20 2300 12/29/20 0539  BP: 101/60 (!) 96/58  Pulse: 79 70  Resp: 18 16  Temp: 98 F (36.7 C) 97.7 F (36.5 C)  SpO2: 98% 99%   CBC Latest Ref Rng & Units 12/27/2020 12/23/2020 06/28/2020  WBC 4.0 - 10.5 K/uL 8.5 7.5 8.7  Hemoglobin 12.0 - 15.0 g/dL 11.1(L) 12.0 13.9  Hematocrit 36.0 - 46.0 % 33.7(L) 37.0 41.3  Platelets 150 - 400 K/uL 218 220 304   NST reactive  Gen: A&O x 3 Abd: soft, NT Chest: nonlabored breathing Abdomen: soft, nondistended nontender. Ext: no signs of DVT  29yo G4P3003 at [redacted]w[redacted]d with PPROM on 3/30 (EDC changed in Epic due to inaccuracy) -S/P BMZ -D7 latency abx -NST TID -Weekly BPP (next due Friday) -S/P MFM consult -S/P NICU consult -CTM for signs of labor, abruption, chorio; otherwise deliver at 34 weeks. -Breech presentation- CS planned if not vertex oat 34 wga -GBS negative   [name] MD

## 2020-12-29 NOTE — Plan of Care (Signed)
  Problem: Clinical Measurements: Goal: Ability to maintain clinical measurements within normal limits will improve Outcome: Progressing Goal: Will remain free from infection Outcome: Progressing   Problem: Activity: Goal: Risk for activity intolerance will decrease Outcome: Progressing   Problem: Nutrition: Goal: Adequate nutrition will be maintained Outcome: Progressing   Problem: Coping: Goal: Level of anxiety will decrease Outcome: Progressing   Problem: Pain Managment: Goal: General experience of comfort will improve Outcome: Progressing   Problem: Education: Goal: Knowledge of disease or condition will improve Outcome: Progressing Goal: Knowledge of the prescribed therapeutic regimen will improve Outcome: Progressing   Problem: Clinical Measurements: Goal: Complications related to the disease process, condition or treatment will be avoided or minimized Outcome: Progressing

## 2020-12-30 LAB — TYPE AND SCREEN
ABO/RH(D): O POS
Antibody Screen: NEGATIVE

## 2020-12-30 NOTE — Progress Notes (Signed)
Feeling good. Scant leaking occasionally. No bleeding, no contractions. Good FM.  Vitals with BMI 12/30/2020 12/29/2020 12/29/2020  Height - - -  Weight 173 lbs 8 oz - 173 lbs 12 oz  BMI 30.74 - 30.79  Systolic 97 98 96  Diastolic 59 57 58  Pulse 74 77 70   Uterus soft, NT  NST reactive  A/P: 29yo G4P3003 at 33 0/7 wks with PPROM on 3/30 (EDC changed in Epic due to inaccuracy) HD #8 -S/P BMZ -latency abx completed -NST TID -Weekly BPP (next due Friday) -S/P MFM consult -S/P NICU consult -CTM for signs of labor, abruption, chorio; otherwise deliver at 34 weeks. -Breech presentation- CS planned if not vertex oat 34 wga -GBSnegative

## 2020-12-31 ENCOUNTER — Inpatient Hospital Stay (HOSPITAL_BASED_OUTPATIENT_CLINIC_OR_DEPARTMENT_OTHER): Payer: BC Managed Care – PPO

## 2020-12-31 DIAGNOSIS — O4393 Unspecified placental disorder, third trimester: Secondary | ICD-10-CM | POA: Diagnosis not present

## 2020-12-31 DIAGNOSIS — Z3A33 33 weeks gestation of pregnancy: Secondary | ICD-10-CM | POA: Diagnosis not present

## 2020-12-31 DIAGNOSIS — O322XX0 Maternal care for transverse and oblique lie, not applicable or unspecified: Secondary | ICD-10-CM | POA: Diagnosis not present

## 2020-12-31 LAB — CBC
HCT: 36.8 % (ref 36.0–46.0)
Hemoglobin: 12.2 g/dL (ref 12.0–15.0)
MCH: 28.5 pg (ref 26.0–34.0)
MCHC: 33.2 g/dL (ref 30.0–36.0)
MCV: 86 fL (ref 80.0–100.0)
Platelets: 243 10*3/uL (ref 150–400)
RBC: 4.28 MIL/uL (ref 3.87–5.11)
RDW: 13.3 % (ref 11.5–15.5)
WBC: 7.5 10*3/uL (ref 4.0–10.5)
nRBC: 0 % (ref 0.0–0.2)

## 2020-12-31 MED ORDER — FAMOTIDINE 20 MG PO TABS
20.0000 mg | ORAL_TABLET | Freq: Every day | ORAL | Status: DC
  Administered 2020-12-31 – 2021-01-05 (×4): 20 mg via ORAL
  Filled 2020-12-31 (×6): qty 1

## 2020-12-31 NOTE — Progress Notes (Signed)
Initial Nutrition Assessment  DOCUMENTATION CODES:   Obesity unspecified  INTERVENTION:  Regular diet Pt may order double protein portions and snacks TID if she makes request when ordering meals   NUTRITION DIAGNOSIS:   Increased nutrient needs related to  (pregnancy and fetal growth requirements) as evidenced by  (33 weeks IUP).  GOAL:   Patient will meet greater than or equal to 90% of their needs,Weight gain   MONITOR:  Weight trends  REASON FOR ASSESSMENT:   Antenatal,LOS   ASSESSMENT:   33 1/7 weeks, adm due to PROM on 3/30. delivery planned for 34 weeks. weight at initial prenatal visit/ 7 weeks: 76.7 kg, BMI 30. 5 lbs weight gain to date   Diet Order:   Diet Order            Diet regular Room service appropriate? Yes; Fluid consistency: Thin  Diet effective now                 EDUCATION NEEDS:   No education needs have been identified at this time  Skin:  Skin Assessment: Reviewed RN Assessment  Height:   Ht Readings from Last 1 Encounters:  12/23/20 5\' 3"  (1.6 m)    Weight:   Wt Readings from Last 1 Encounters:  12/31/20 78.8 kg    Ideal Body Weight:   115 lbs  BMI:  Body mass index is 30.78 kg/m.  Estimated Nutritional Needs:   Kcal:  1900-2100  Protein:  85-95 g  Fluid:  2.2 L

## 2020-12-31 NOTE — Progress Notes (Signed)
No complaints.  Finally able to sleep last night.  Active FM.  Scant blood this am; none with wiping.  No CTX or abdominal pain.  Vitals:   12/31/20 0543 12/31/20 0830  BP: 106/64 101/68  Pulse: 70 82  Resp: 18 18  Temp: 97.9 F (36.6 C) 97.8 F (36.6 C)  SpO2: 98% 98%   CBC Latest Ref Rng & Units 12/27/2020 12/23/2020 06/28/2020  WBC 4.0 - 10.5 K/uL 8.5 7.5 8.7  Hemoglobin 12.0 - 15.0 g/dL 11.1(L) 12.0 13.9  Hematocrit 36.0 - 46.0 % 33.7(L) 37.0 41.3  Platelets 150 - 400 K/uL 218 220 304   NST reactive  Gen: A&O x 3 Abd: soft, NT Ext: no c/c/e  29yo G4P3003 at [redacted]w[redacted]d with PPROM on 3/30  -S/P BMZ -S/Platency abx -NST TID -Weekly BPP which is ordered for today -S/P MFM consult -S/P NICU consult -CTM for signes of labor, abruption, chorio; otherwise deliver at 34 weeks -Breech presentation-patient counseled that this is unlikely to change given PPROM. She is counseled that if breech when delivery is indicated, will proceed with C/S. She is counseled re: risk of bleeding, infection, scarring and damage to surrounding structures.  -GBSnegative  [employer], DO

## 2021-01-01 NOTE — Progress Notes (Signed)
Problem List : PPROM 3/30 Unstable lie  S:  Patient doing well> No complaints. Minimal leakage of fluid. Active fetal movement.  O:  BP 101/62 (BP Location: Left Arm)   Pulse 75   Temp 98.3 F (36.8 C) (Oral)   Resp 18   Ht 5\' 3"  (1.6 m)   Wt 77.5 kg   LMP 05/13/2020   SpO2 98%   BMI 30.26 kg/m  Results for orders placed or performed during the hospital encounter of 12/23/20 (from the past 24 hour(s))  CBC     Status: None   Collection Time: 12/31/20  9:00 AM  Result Value Ref Range   WBC 7.5 4.0 - 10.5 K/uL   RBC 4.28 3.87 - 5.11 MIL/uL   Hemoglobin 12.2 12.0 - 15.0 g/dL   HCT 03/02/21 73.2 - 20.2 %   MCV 86.0 80.0 - 100.0 fL   MCH 28.5 26.0 - 34.0 pg   MCHC 33.2 30.0 - 36.0 g/dL   RDW 54.2 70.6 - 23.7 %   Platelets 243 150 - 400 K/uL   nRBC 0.0 0.0 - 0.2 %   Abdomen is soft and non tender  IMPRESSION: IUP at 33 w 2 days  PPROM since March 30  PLAN: No current evidence of chorioamnionitis  Status post steroids and latency antibiotics Unstable lie - C Section for delivery if not cephalic  Continue current plan

## 2021-01-02 LAB — TYPE AND SCREEN
ABO/RH(D): O POS
Antibody Screen: NEGATIVE

## 2021-01-02 NOTE — Progress Notes (Signed)
No complaints.  Leaking small amount. Very active baby. No contractions.  BP (!) 99/55 (BP Location: Left Arm)   Pulse 68   Temp 98.3 F (36.8 C) (Oral)   Resp 17   Ht 5\' 3"  (1.6 m)   Wt 78.9 kg   LMP 05/13/2020   SpO2 99%   BMI 30.82 kg/m  Results for orders placed or performed during the hospital encounter of 12/23/20 (from the past 24 hour(s))  Type and screen [facility]     Status: None   Collection Time: 01/02/21  5:28 AM  Result Value Ref Range   ABO/RH(D) O POS    Antibody Screen NEG    Sample Expiration      01/05/2021,2359 Performed at [facility], [address] [place], [place]    Abdomen is soft and non tender  IMPRESSION: IUP at 33 w 3 days  PPROM Unstable lie  PLAN: C section scheduled for April 13 at 0830 No evidence of chorioamnionitis Continue current plan

## 2021-01-03 LAB — CBC
HCT: 36 % (ref 36.0–46.0)
Hemoglobin: 11.8 g/dL — ABNORMAL LOW (ref 12.0–15.0)
MCH: 27.8 pg (ref 26.0–34.0)
MCHC: 32.8 g/dL (ref 30.0–36.0)
MCV: 84.9 fL (ref 80.0–100.0)
Platelets: 231 10*3/uL (ref 150–400)
RBC: 4.24 MIL/uL (ref 3.87–5.11)
RDW: 13.3 % (ref 11.5–15.5)
WBC: 8.1 10*3/uL (ref 4.0–10.5)
nRBC: 0 % (ref 0.0–0.2)

## 2021-01-03 LAB — RPR: RPR Ser Ql: NONREACTIVE

## 2021-01-03 NOTE — Progress Notes (Signed)
  No complaints. Active baby  Denies contractions  BP 109/63 (BP Location: Left Arm)   Pulse 69   Temp 98.2 F (36.8 C) (Oral)   Resp 16   Ht 5\' 3"  (1.6 m)   Wt 78.9 kg   LMP 05/13/2020   SpO2 98%   BMI 30.82 kg/m  No results found for this or any previous visit (from the past 24 hour(s)). Abdomen is soft and non tender  IMPRESSION: IUP at 33 w 4 days  PPROM Unstable lie  PLAN: Status post steroids and antibiotics No evidence of chorioamnionitis c section scheduled for April 13 at 0830

## 2021-01-04 LAB — CBC
HCT: 36.8 % (ref 36.0–46.0)
Hemoglobin: 12.1 g/dL (ref 12.0–15.0)
MCH: 28.1 pg (ref 26.0–34.0)
MCHC: 32.9 g/dL (ref 30.0–36.0)
MCV: 85.4 fL (ref 80.0–100.0)
Platelets: 241 10*3/uL (ref 150–400)
RBC: 4.31 MIL/uL (ref 3.87–5.11)
RDW: 13.2 % (ref 11.5–15.5)
WBC: 8.2 10*3/uL (ref 4.0–10.5)
nRBC: 0 % (ref 0.0–0.2)

## 2021-01-04 LAB — TYPE AND SCREEN
ABO/RH(D): O POS
Antibody Screen: NEGATIVE

## 2021-01-04 MED ORDER — LACTATED RINGERS IV SOLN: INTRAVENOUS | Status: DC

## 2021-01-04 NOTE — Progress Notes (Signed)
33 5/7 wks A few UCs last pm with scant leaking. No bleeding. She slept through the UCs.  Today's Vitals   01/04/21 0228 01/04/21 0346 01/04/21 0625 01/04/21 0737  BP:   (!) 104/58 (!) 104/58  Pulse:   71 70  Resp:   16 18  Temp:   98.1 F (36.7 C) 98 F (36.7 C)  TempSrc:   Oral Oral  SpO2:   97% 99%  Weight:      Height:      PainSc: Asleep Asleep     Body mass index is 30.82 kg/m.  Uterus soft, NT  NST reactive, irregular short UCs traced  A/P: PPROM         Unstable lie         BMZ and ATB series complete         C/S 01/06/21  8:30 am         CBC, T&S today

## 2021-01-04 NOTE — Progress Notes (Signed)
Pt called out complaining of new onset bright red vaginal bleeding and uterine ctx. Placed on monitor and saline lock placed. Dr. Henderson Cloud in department and notified.

## 2021-01-04 NOTE — Progress Notes (Signed)
Follow up visit with Deanna Aguirre. Pt reports she is tired and ready to be out of the hospital. She is really missing her daughters and eager to see them. Pt was more quiet today answering primarily in one to two word statements. She reports she is being monitored because of increased bleeding and contractions, but is not worried because she already has a c-section scheduled for Wednesday.    Please page as further needs arise.  [name]. [name], M.Div. [facility] Chaplain Pager [phone] Office [phone]

## 2021-01-04 NOTE — Progress Notes (Signed)
About 10:45 am today noted some vaginal bleeding with voiding.  Felt some mild pelvic pressure on/off.   IV fluids were started, NPO and CBC with T&S was done.  Last time to BR she noted slight blood on tissue. Pelvic pressure is intermittent and less frequent now.  PE:  Today's Vitals   01/04/21 0737 01/04/21 0840 01/04/21 1036 01/04/21 1147  BP: (!) 104/58   122/78  Pulse: 70   92  Resp: 18   18  Temp: 98 F (36.7 C)   98.2 F (36.8 C)  TempSrc: Oral   Oral  SpO2: 99%   99%  Weight:      Height:      PainSc:  0-No pain 4     Body mass index is 30.82 kg/m.   Uterus soft, NT FHT cat one UCs irregular, mild, < 1 minute  BSUS earlier today> transverse, fetal head to left  Results for orders placed or performed during the hospital encounter of 12/23/20 (from the past 24 hour(s))  CBC     Status: None   Collection Time: 01/04/21  8:43 AM  Result Value Ref Range   WBC 8.2 4.0 - 10.5 K/uL   RBC 4.31 3.87 - 5.11 MIL/uL   Hemoglobin 12.1 12.0 - 15.0 g/dL   HCT 60.1 09.3 - 23.5 %   MCV 85.4 80.0 - 100.0 fL   MCH 28.1 26.0 - 34.0 pg   MCHC 32.9 30.0 - 36.0 g/dL   RDW 57.3 22.0 - 25.4 %   Platelets 241 150 - 400 K/uL   nRBC 0.0 0.0 - 0.2 %  Type and screen [facility]     Status: None   Collection Time: 01/04/21  8:43 AM  Result Value Ref Range   ABO/RH(D) O POS    Antibody Screen NEG    Sample Expiration      01/07/2021,2359 Performed at [facility], [address], [place], [place] [address]    A/P: She appears stable right now         Will give her a regular diet, continue IV fluids and monitor qhsift or prn increasing sxs.         Reviewed C/S for delivery, risks including infection, organ damage, bleeding/transfusion-HIV/Hep, DVT/PE, pneumonia. D/W possible classical extension of uterine incision for transverse lie with necessity of repeat C/S with future pregnancies. She state she understands.

## 2021-01-04 NOTE — Progress Notes (Signed)
Patient given Ativan 1 mg as order; dispensed by pharmacy. 1 mg wasted in stericycle. Verified by Myriam Jacobson, RN

## 2021-01-05 MED ORDER — POVIDONE-IODINE 10 % EX SWAB: 2.0000 "application " | Freq: Once | CUTANEOUS | Status: DC

## 2021-01-05 MED ORDER — CEFAZOLIN SODIUM-DEXTROSE 2-4 GM/100ML-% IV SOLN
2.0000 g | INTRAVENOUS | Status: DC
  Filled 2021-01-05: qty 100

## 2021-01-05 NOTE — Progress Notes (Signed)
Antepartum Progress Note   Deanna Aguirre is a 30 y.o. female [phone] at [redacted]w[redacted]d that is admitted for PPROM, now hospital day 59.  Yesterday, she reported vaginal bleeding during voiding. She underwent continuous monitoring and CBC was reassuring.  Bleeding did not continue.  Denies bleeding or cramping overnight. Fluid loss is slow, stable. +FM. Denies fever, chills, SOB, CP.      Blood pressure 97/62, pulse 68, temperature 98.6 F (37 C), temperature source Oral, resp. rate 16, height 5\' 3"  (1.6 m), weight 78.9 kg, last menstrual period 05/13/2020, SpO2 97 %, unknown if currently breastfeeding. Exam Physical Exam   Gen: alert, no distress Chest: nonlabored breathing CV: no peripheral edema Ext: no signs of DVT  NST reactive and reassuring, rare ctx on toco.  Assessment/Plan: . Admitted to [facility] specialty care, PPROM . S/p BMZ, s/p latency Abx . Bleeding episode yesterday, HGB stable at 12.1.  . S/p MFM consult, s/p NICU consult . TID NST . Continue to monitor for signs of chorio and/or PTL . Unstable lie, most recently transverse on [facility] 4/11.  Counseled on cesarean section for delivery. . Planned C section tomorrow at [redacted]w[redacted]d.  Will by NPO at midnight.    [redacted]w[redacted]d 01/05/2021, 7:33 AM

## 2021-01-06 ENCOUNTER — Encounter (HOSPITAL_COMMUNITY): Payer: Self-pay | Admitting: Obstetrics and Gynecology

## 2021-01-06 ENCOUNTER — Encounter (HOSPITAL_COMMUNITY)
Admission: AD | Disposition: A | Discharge status: Home / Self Care | Payer: Self-pay | Source: Home / Self Care | Attending: Obstetrics and Gynecology

## 2021-01-06 ENCOUNTER — Inpatient Hospital Stay (HOSPITAL_COMMUNITY): Payer: BC Managed Care – PPO

## 2021-01-06 LAB — CBC
HCT: 35.1 % — ABNORMAL LOW (ref 36.0–46.0)
Hemoglobin: 11.4 g/dL — ABNORMAL LOW (ref 12.0–15.0)
MCH: 27.9 pg (ref 26.0–34.0)
MCHC: 32.5 g/dL (ref 30.0–36.0)
MCV: 86 fL (ref 80.0–100.0)
Platelets: 224 10*3/uL (ref 150–400)
RBC: 4.08 MIL/uL (ref 3.87–5.11)
RDW: 13.2 % (ref 11.5–15.5)
WBC: 6.8 10*3/uL (ref 4.0–10.5)
nRBC: 0 % (ref 0.0–0.2)

## 2021-01-06 SURGERY — Surgical Case
Anesthesia: Spinal | Site: Abdomen | Wound class: Clean Contaminated

## 2021-01-06 MED ORDER — ONDANSETRON HCL 4 MG/2ML IJ SOLN
4.0000 mg | Freq: Three times a day (TID) | INTRAMUSCULAR | Status: DC | PRN
  Administered 2021-01-06: 4 mg via INTRAVENOUS
  Filled 2021-01-06: qty 2

## 2021-01-06 MED ORDER — ZOLPIDEM TARTRATE 5 MG PO TABS: 5.0000 mg | ORAL_TABLET | Freq: Every evening | ORAL | Status: DC | PRN

## 2021-01-06 MED ORDER — COCONUT OIL OIL: 1.0000 "application " | TOPICAL_OIL | Status: DC | PRN

## 2021-01-06 MED ORDER — FENTANYL CITRATE (PF) 100 MCG/2ML IJ SOLN
INTRAMUSCULAR | Status: AC
  Filled 2021-01-06: qty 2

## 2021-01-06 MED ORDER — MEPERIDINE HCL 25 MG/ML IJ SOLN: 6.2500 mg | INTRAMUSCULAR | Status: DC | PRN

## 2021-01-06 MED ORDER — SENNOSIDES-DOCUSATE SODIUM 8.6-50 MG PO TABS
2.0000 | ORAL_TABLET | Freq: Every day | ORAL | Status: DC
  Administered 2021-01-07 – 2021-01-08 (×3): 2 via ORAL
  Filled 2021-01-06 (×3): qty 2

## 2021-01-06 MED ORDER — LACTATED RINGERS IV SOLN: INTRAVENOUS | Status: DC | PRN

## 2021-01-06 MED ORDER — DIPHENHYDRAMINE HCL 50 MG/ML IJ SOLN: 12.5000 mg | INTRAMUSCULAR | Status: DC | PRN

## 2021-01-06 MED ORDER — OXYCODONE HCL 5 MG PO TABS: 5.0000 mg | ORAL_TABLET | ORAL | Status: DC | PRN

## 2021-01-06 MED ORDER — NALBUPHINE HCL 10 MG/ML IJ SOLN: 5.0000 mg | INTRAMUSCULAR | Status: DC | PRN

## 2021-01-06 MED ORDER — OXYTOCIN-SODIUM CHLORIDE 30-0.9 UT/500ML-% IV SOLN: 2.5000 [IU]/h | INTRAVENOUS | Status: AC

## 2021-01-06 MED ORDER — DIBUCAINE (PERIANAL) 1 % EX OINT
1.0000 | TOPICAL_OINTMENT | CUTANEOUS | Status: DC | PRN
Start: 2021-01-06 — End: 2021-01-08

## 2021-01-06 MED ORDER — NALBUPHINE HCL 10 MG/ML IJ SOLN
5.0000 mg | INTRAMUSCULAR | Status: DC | PRN
Start: 2021-01-06 — End: 2021-01-08

## 2021-01-06 MED ORDER — MENTHOL 3 MG MT LOZG: 1.0000 | LOZENGE | OROMUCOSAL | Status: DC | PRN

## 2021-01-06 MED ORDER — PHENYLEPHRINE HCL-NACL 20-0.9 MG/250ML-% IV SOLN
INTRAVENOUS | Status: DC | PRN
  Administered 2021-01-06: 60 ug/min via INTRAVENOUS

## 2021-01-06 MED ORDER — WITCH HAZEL-GLYCERIN EX PADS: 1.0000 "application " | MEDICATED_PAD | CUTANEOUS | Status: DC | PRN

## 2021-01-06 MED ORDER — SODIUM CHLORIDE 0.9 % IR SOLN
Status: DC | PRN
  Administered 2021-01-06: 1

## 2021-01-06 MED ORDER — PROPOFOL 10 MG/ML IV BOLUS
INTRAVENOUS | Status: DC | PRN
  Administered 2021-01-06 (×10): 2 mg via INTRAVENOUS

## 2021-01-06 MED ORDER — SCOPOLAMINE 1 MG/3DAYS TD PT72
MEDICATED_PATCH | TRANSDERMAL | Status: DC | PRN
  Administered 2021-01-06: 1 via TRANSDERMAL

## 2021-01-06 MED ORDER — NALOXONE HCL 4 MG/10ML IJ SOLN
1.0000 ug/kg/h | INTRAVENOUS | Status: DC | PRN
  Filled 2021-01-06: qty 5

## 2021-01-06 MED ORDER — TETANUS-DIPHTH-ACELL PERTUSSIS 5-2.5-18.5 LF-MCG/0.5 IM SUSY: 0.5000 mL | PREFILLED_SYRINGE | Freq: Once | INTRAMUSCULAR | Status: DC

## 2021-01-06 MED ORDER — METOCLOPRAMIDE HCL 5 MG/ML IJ SOLN
INTRAMUSCULAR | Status: AC
  Filled 2021-01-06: qty 2

## 2021-01-06 MED ORDER — SIMETHICONE 80 MG PO CHEW: 80.0000 mg | CHEWABLE_TABLET | ORAL | Status: DC | PRN

## 2021-01-06 MED ORDER — METOCLOPRAMIDE HCL 5 MG/ML IJ SOLN
INTRAMUSCULAR | Status: DC | PRN
  Administered 2021-01-06: 10 mg via INTRAVENOUS

## 2021-01-06 MED ORDER — FENTANYL CITRATE (PF) 100 MCG/2ML IJ SOLN
INTRAMUSCULAR | Status: DC | PRN
  Administered 2021-01-06: 15 ug via INTRATHECAL

## 2021-01-06 MED ORDER — BUPIVACAINE IN DEXTROSE 0.75-8.25 % IT SOLN
INTRATHECAL | Status: DC | PRN
  Administered 2021-01-06: 1.4 mL via INTRATHECAL

## 2021-01-06 MED ORDER — ACETAMINOPHEN 500 MG PO TABS
1000.0000 mg | ORAL_TABLET | Freq: Four times a day (QID) | ORAL | Status: DC
  Administered 2021-01-07 – 2021-01-08 (×5): 1000 mg via ORAL
  Filled 2021-01-06 (×7): qty 2

## 2021-01-06 MED ORDER — SIMETHICONE 80 MG PO CHEW
80.0000 mg | CHEWABLE_TABLET | Freq: Three times a day (TID) | ORAL | Status: DC
  Administered 2021-01-06 – 2021-01-08 (×6): 80 mg via ORAL
  Filled 2021-01-06 (×6): qty 1

## 2021-01-06 MED ORDER — SCOPOLAMINE 1 MG/3DAYS TD PT72: 1.0000 | MEDICATED_PATCH | Freq: Once | TRANSDERMAL | Status: DC

## 2021-01-06 MED ORDER — CEFAZOLIN SODIUM-DEXTROSE 2-3 GM-%(50ML) IV SOLR
INTRAVENOUS | Status: DC | PRN
  Administered 2021-01-06: 2 g via INTRAVENOUS

## 2021-01-06 MED ORDER — EPHEDRINE 5 MG/ML INJ
INTRAVENOUS | Status: AC
  Filled 2021-01-06: qty 10

## 2021-01-06 MED ORDER — PRENATAL MULTIVITAMIN CH
1.0000 | ORAL_TABLET | Freq: Every day | ORAL | Status: DC
  Filled 2021-01-06: qty 1

## 2021-01-06 MED ORDER — ACETAMINOPHEN 500 MG PO TABS: 1000.0000 mg | ORAL_TABLET | Freq: Four times a day (QID) | ORAL | Status: DC

## 2021-01-06 MED ORDER — KETOROLAC TROMETHAMINE 30 MG/ML IJ SOLN
INTRAMUSCULAR | Status: AC
  Filled 2021-01-06: qty 1

## 2021-01-06 MED ORDER — PROPOFOL 10 MG/ML IV BOLUS
INTRAVENOUS | Status: AC
  Filled 2021-01-06: qty 20

## 2021-01-06 MED ORDER — OXYTOCIN-SODIUM CHLORIDE 30-0.9 UT/500ML-% IV SOLN: INTRAVENOUS | Status: DC | PRN

## 2021-01-06 MED ORDER — NALBUPHINE HCL 10 MG/ML IJ SOLN: 5.0000 mg | Freq: Once | INTRAMUSCULAR | Status: DC | PRN

## 2021-01-06 MED ORDER — ONDANSETRON HCL 4 MG/2ML IJ SOLN
INTRAMUSCULAR | Status: DC | PRN
  Administered 2021-01-06: 4 mg via INTRAVENOUS

## 2021-01-06 MED ORDER — NALBUPHINE HCL 10 MG/ML IJ SOLN
5.0000 mg | Freq: Once | INTRAMUSCULAR | Status: DC | PRN
Start: 2021-01-06 — End: 2021-01-08

## 2021-01-06 MED ORDER — DEXAMETHASONE SODIUM PHOSPHATE 4 MG/ML IJ SOLN
INTRAMUSCULAR | Status: DC | PRN
  Administered 2021-01-06: 8 mg via INTRAVENOUS

## 2021-01-06 MED ORDER — PHENYLEPHRINE HCL (PRESSORS) 10 MG/ML IV SOLN
INTRAVENOUS | Status: DC | PRN
  Administered 2021-01-06: 80 ug via INTRAVENOUS
  Administered 2021-01-06: 120 ug via INTRAVENOUS
  Administered 2021-01-06: 80 ug via INTRAVENOUS

## 2021-01-06 MED ORDER — PHENYLEPHRINE HCL-NACL 20-0.9 MG/250ML-% IV SOLN
INTRAVENOUS | Status: AC
  Filled 2021-01-06: qty 250

## 2021-01-06 MED ORDER — MORPHINE SULFATE (PF) 0.5 MG/ML IJ SOLN
INTRAMUSCULAR | Status: DC | PRN
  Administered 2021-01-06: .15 mg via INTRATHECAL

## 2021-01-06 MED ORDER — IBUPROFEN 800 MG PO TABS
800.0000 mg | ORAL_TABLET | Freq: Four times a day (QID) | ORAL | Status: DC
  Administered 2021-01-07 – 2021-01-08 (×2): 800 mg via ORAL
  Filled 2021-01-06 (×3): qty 1

## 2021-01-06 MED ORDER — DIPHENHYDRAMINE HCL 25 MG PO CAPS: 25.0000 mg | ORAL_CAPSULE | ORAL | Status: DC | PRN

## 2021-01-06 MED ORDER — SCOPOLAMINE 1 MG/3DAYS TD PT72
MEDICATED_PATCH | TRANSDERMAL | Status: AC
  Filled 2021-01-06: qty 1

## 2021-01-06 MED ORDER — DEXAMETHASONE SODIUM PHOSPHATE 4 MG/ML IJ SOLN
INTRAMUSCULAR | Status: AC
  Filled 2021-01-06: qty 2

## 2021-01-06 MED ORDER — DEXMEDETOMIDINE (PRECEDEX) IN NS 20 MCG/5ML (4 MCG/ML) IV SYRINGE
PREFILLED_SYRINGE | INTRAVENOUS | Status: AC
  Filled 2021-01-06: qty 5

## 2021-01-06 MED ORDER — DIPHENHYDRAMINE HCL 50 MG/ML IJ SOLN
INTRAMUSCULAR | Status: DC | PRN
  Administered 2021-01-06: 6.25 mg via INTRAVENOUS

## 2021-01-06 MED ORDER — OXYTOCIN-SODIUM CHLORIDE 30-0.9 UT/500ML-% IV SOLN
INTRAVENOUS | Status: DC | PRN
  Administered 2021-01-06: 300 mL via INTRAVENOUS

## 2021-01-06 MED ORDER — PHENYLEPHRINE 40 MCG/ML (10ML) SYRINGE FOR IV PUSH (FOR BLOOD PRESSURE SUPPORT)
PREFILLED_SYRINGE | INTRAVENOUS | Status: AC
  Filled 2021-01-06: qty 10

## 2021-01-06 MED ORDER — DIPHENHYDRAMINE HCL 25 MG PO CAPS: 25.0000 mg | ORAL_CAPSULE | Freq: Four times a day (QID) | ORAL | Status: DC | PRN

## 2021-01-06 MED ORDER — PROMETHAZINE HCL 25 MG/ML IJ SOLN: 6.2500 mg | INTRAMUSCULAR | Status: DC | PRN

## 2021-01-06 MED ORDER — LACTATED RINGERS IV SOLN: INTRAVENOUS | Status: DC

## 2021-01-06 MED ORDER — SOD CITRATE-CITRIC ACID 500-334 MG/5ML PO SOLN
ORAL | Status: AC
  Administered 2021-01-06: 30 mL
  Filled 2021-01-06: qty 15

## 2021-01-06 MED ORDER — KETOROLAC TROMETHAMINE 30 MG/ML IJ SOLN: 30.0000 mg | Freq: Four times a day (QID) | INTRAMUSCULAR | Status: AC | PRN

## 2021-01-06 MED ORDER — SODIUM CHLORIDE 0.9% FLUSH: 3.0000 mL | INTRAVENOUS | Status: DC | PRN

## 2021-01-06 MED ORDER — DEXMEDETOMIDINE (PRECEDEX) IN NS 20 MCG/5ML (4 MCG/ML) IV SYRINGE
PREFILLED_SYRINGE | INTRAVENOUS | Status: DC | PRN
  Administered 2021-01-06: 4 ug via INTRAVENOUS
  Administered 2021-01-06: 8 ug via INTRAVENOUS

## 2021-01-06 MED ORDER — KETOROLAC TROMETHAMINE 30 MG/ML IJ SOLN
30.0000 mg | Freq: Once | INTRAMUSCULAR | Status: AC | PRN
  Administered 2021-01-06: 30 mg via INTRAVENOUS

## 2021-01-06 MED ORDER — KETOROLAC TROMETHAMINE 30 MG/ML IJ SOLN
30.0000 mg | Freq: Four times a day (QID) | INTRAMUSCULAR | Status: AC
  Administered 2021-01-06 – 2021-01-07 (×4): 30 mg via INTRAVENOUS
  Filled 2021-01-06 (×4): qty 1

## 2021-01-06 MED ORDER — OXYTOCIN-SODIUM CHLORIDE 30-0.9 UT/500ML-% IV SOLN
INTRAVENOUS | Status: AC
  Filled 2021-01-06: qty 500

## 2021-01-06 MED ORDER — STERILE WATER FOR IRRIGATION IR SOLN
Status: DC | PRN
  Administered 2021-01-06: 1000 mL

## 2021-01-06 MED ORDER — HYDROMORPHONE HCL 1 MG/ML IJ SOLN: 0.2500 mg | INTRAMUSCULAR | Status: DC | PRN

## 2021-01-06 MED ORDER — PHENYLEPHRINE 40 MCG/ML (10ML) SYRINGE FOR IV PUSH (FOR BLOOD PRESSURE SUPPORT)
PREFILLED_SYRINGE | INTRAVENOUS | Status: AC
  Filled 2021-01-06: qty 30

## 2021-01-06 MED ORDER — HYDROMORPHONE HCL 1 MG/ML IJ SOLN
0.2000 mg | INTRAMUSCULAR | Status: DC | PRN
Start: 2021-01-06 — End: 2021-01-08

## 2021-01-06 MED ORDER — ONDANSETRON HCL 4 MG/2ML IJ SOLN
INTRAMUSCULAR | Status: AC
  Filled 2021-01-06: qty 2

## 2021-01-06 MED ORDER — NALOXONE HCL 0.4 MG/ML IJ SOLN: 0.4000 mg | INTRAMUSCULAR | Status: DC | PRN

## 2021-01-06 MED ORDER — MORPHINE SULFATE (PF) 0.5 MG/ML IJ SOLN
INTRAMUSCULAR | Status: AC
  Filled 2021-01-06: qty 10

## 2021-01-06 SURGICAL SUPPLY — 38 items
APL SKNCLS STERI-STRIP NONHPOA (GAUZE/BANDAGES/DRESSINGS) ×1
BENZOIN TINCTURE PRP APPL 2/3 (GAUZE/BANDAGES/DRESSINGS) ×2 IMPLANT
CHLORAPREP W/TINT 26ML (MISCELLANEOUS) ×2 IMPLANT
CLAMP CORD UMBIL (MISCELLANEOUS) IMPLANT
CLOSURE STERI STRIP 1/2 X4 (GAUZE/BANDAGES/DRESSINGS) ×1 IMPLANT
CLOTH BEACON ORANGE TIMEOUT ST (SAFETY) ×2 IMPLANT
CLSR STERI-STRIP ANTIMIC 1/2X4 (GAUZE/BANDAGES/DRESSINGS) ×2 IMPLANT
DRSG OPSITE POSTOP 4X10 (GAUZE/BANDAGES/DRESSINGS) ×2 IMPLANT
ELECT REM PT RETURN 9FT ADLT (ELECTROSURGICAL) ×2
ELECTRODE REM PT RTRN 9FT ADLT (ELECTROSURGICAL) ×1 IMPLANT
EXTRACTOR VACUUM KIWI (MISCELLANEOUS) IMPLANT
GAUZE SPONGE 4X4 12PLY STRL (GAUZE/BANDAGES/DRESSINGS) ×2 IMPLANT
GAUZE SPONGE 4X4 12PLY STRL LF (GAUZE/BANDAGES/DRESSINGS) ×2 IMPLANT
GLOVE BIO SURGEON STRL SZ 6.5 (GLOVE) ×2 IMPLANT
GLOVE BIOGEL PI IND STRL 6.5 (GLOVE) ×1 IMPLANT
GLOVE BIOGEL PI IND STRL 7.0 (GLOVE) ×2 IMPLANT
GLOVE BIOGEL PI INDICATOR 6.5 (GLOVE) ×1
GLOVE BIOGEL PI INDICATOR 7.0 (GLOVE) ×2
GOWN STRL REUS W/TWL LRG LVL3 (GOWN DISPOSABLE) ×4 IMPLANT
HEMOSTAT ARISTA ABSORB 3G PWDR (HEMOSTASIS) ×2 IMPLANT
KIT ABG SYR 3ML LUER SLIP (SYRINGE) ×2 IMPLANT
NEEDLE HYPO 25X5/8 SAFETYGLIDE (NEEDLE) ×2 IMPLANT
NS IRRIG 1000ML POUR BTL (IV SOLUTION) ×2 IMPLANT
PACK C SECTION WH (CUSTOM PROCEDURE TRAY) ×2 IMPLANT
PAD ABD 7.5X8 STRL (GAUZE/BANDAGES/DRESSINGS) ×1 IMPLANT
PAD OB MATERNITY 4.3X12.25 (PERSONAL CARE ITEMS) ×2 IMPLANT
PENCIL SMOKE EVAC W/HOLSTER (ELECTROSURGICAL) ×2 IMPLANT
STRIP CLOSURE SKIN 1/2X4 (GAUZE/BANDAGES/DRESSINGS) ×2 IMPLANT
SUT PLAIN 0 NONE (SUTURE) IMPLANT
SUT PLAIN 2 0 (SUTURE) ×2
SUT PLAIN ABS 2-0 CT1 27XMFL (SUTURE) ×1 IMPLANT
SUT VIC AB 0 CT1 36 (SUTURE) ×2 IMPLANT
SUT VIC AB 0 CTX 36 (SUTURE) ×6
SUT VIC AB 0 CTX36XBRD ANBCTRL (SUTURE) ×3 IMPLANT
SUT VIC AB 4-0 PS2 27 (SUTURE) ×2 IMPLANT
TOWEL OR 17X24 6PK STRL BLUE (TOWEL DISPOSABLE) ×2 IMPLANT
TRAY FOLEY W/BAG SLVR 14FR LF (SET/KITS/TRAYS/PACK) IMPLANT
WATER STERILE IRR 1000ML POUR (IV SOLUTION) ×2 IMPLANT

## 2021-01-06 NOTE — Op Note (Signed)
PROCEDURE DATE: 01/06/21   PREOPERATIVE DIAGNOSIS: PPROM, IUP at 34 wga, transverse presentation   POSTOPERATIVE DIAGNOSIS: The same   PROCEDURE:    Primary MIDTransverse Cesarean Section   SURGEON:  Dr. [name]   INDICATIONS: This is a 30yo [id number] at 34.0 wga requiring cesarean section secondary to PPROM. She had PPROM at 75 wga and completed latency antibiotics and BMZ course. She declined expectant management past 34.0 wga. Decision made to proceed with LTCS. The risks of cesarean section discussed with the patient included but were not limited to: bleeding which may require transfusion or reoperation; infection which may require antibiotics; injury to bowel, bladder, ureters or other surrounding organs; injury to the fetus; need for additional procedures including hysterectomy in the event of a life-threatening hemorrhage; placental abnormalities wth subsequent pregnancies, incisional problems, thromboembolic phenomenon and other postoperative/anesthesia complications. The patient agreed with the proposed plan, giving informed consent for the procedure.     FINDINGS:  Viable female infant in transverse presentation, APGARs pending,  Weight pending, Amniotic fluid clear,  Intact placenta, three vessel cord.  Grossly normal uterus but underdeveloped LUS .   ANESTHESIA:    Epidural ESTIMATED BLOOD LOSS: 522cc SPECIMENS: Placenta for pathology COMPLICATIONS: None immediate   PROCEDURE IN DETAIL:  The patient received intravenous antibiotics (2g Ancef) and had sequential compression devices applied to her lower extremities while in the preoperative area.  She was then taken to the operating room where epidural anesthesia was dosed up to surgical level and was found to be adequate. She was then placed in a dorsal supine position with a leftward tilt, and prepped and draped in a sterile manner.  A foley catheter was placed into her bladder and attached to constant gravity.  After an adequate  timeout was performed, a Pfannenstiel skin incision was made with scalpel and carried through to the underlying layer of fascia. The fascia was incised in the midline and this incision was extended bilaterally using the Mayo scissors. Kocher clamps were applied to the superior aspect of the fascial incision and the underlying rectus muscles were dissected off bluntly. A similar process was carried out on the inferior aspect of the facial incision. The rectus muscles were separated in the midline bluntly and the peritoneum was entered bluntly. A transverse hysterotomy was made with a scalpel and extended bilaterally bluntly about 2cm above LUS as the LUS was underdeveloped. The bladder blade was then removed. The infant was successfully delivered via typical breech manuevers. Although baby was transverse, the first thing presenting at the incision in the mid uterus was a foot. The cord was clamped and cut and infant was handed over to awaiting neonatology team. Uterine massage was then administered and the placenta delivered intact with three-vessel cord. Cord gases were taken. The uterus was cleared of clot and debris.  The hysterotomy was closed with 0 vicryl.  A second imbricating layer was done. A third layer with suture of 0-vicryl was used to reinforce the incision and aid in hemostasis.Arista placed. The fascia was closed with 0-Vicryl in a running fashion with good restoration of anatomy.  The subcutaneus tissue was irrigated and was reapproximated using three interrupted plain gut stitches.  The skin was closed with 4-0 Vicryl in a subcuticular fashion.  All surgical site and was hemostatic at end of procedure) without any further bleeding on exam.    It's a boy - Noel Gerold - to join his three sisters!!   It was discussed w/ the patient that if  she were to have another baby she would need a cesarean section given high incision.   Pt tolerated the procedure well. All sponge/lap/needle counts were correct   X 2. Pt taken to recovery room in stable condition.     [name] MD

## 2021-01-06 NOTE — Anesthesia Procedure Notes (Signed)
Spinal  Patient location during procedure: OR Start time: 01/06/2021 8:45 AM End time: 01/06/2021 8:47 AM Reason for block: surgical anesthesia Staffing Performed: anesthesiologist  Anesthesiologist: Leilani Able, MD Preanesthetic Checklist Completed: patient identified, IV checked, site marked, risks and benefits discussed, surgical consent, monitors and equipment checked, pre-op evaluation and timeout performed Spinal Block Patient position: sitting Prep: DuraPrep and site prepped and draped Patient monitoring: continuous pulse ox and blood pressure Approach: midline Location: L3-4 Injection technique: single-shot Needle Needle type: Pencan  Needle gauge: 24 G Needle length: 10 cm Needle insertion depth: 6 cm Assessment Sensory level: T4 Events: CSF return

## 2021-01-06 NOTE — Progress Notes (Signed)
Scheduled for CS today.  BSUS confirms non-vertex presentation. Head in LUQ. Transverse back down. Anterior placenta. D/w patient the possibility of difficult delivery given presentation.  She is 100% certain of her choice to be delivered at 34.0 wga and does not desire further expectant management. Risks discussed including infection, bleeding, damage to surrounding structures, the need for additional procedures including hysterectomy, and the possibility of uterine rupture with neonatal morbidity/mortality, scarring, and abnormal placentation with subsequent pregnancies. Patient agrees to proceed. 2g ancef on call to OR.    Rosie Fate MD

## 2021-01-06 NOTE — Anesthesia Preprocedure Evaluation (Signed)
Anesthesia Evaluation  Patient identified by MRN, date of birth, ID band Patient awake    Reviewed: Allergy & Precautions, H&P , NPO status , Patient's Chart, lab work & pertinent test results  Airway Mallampati: I  TM Distance: >3 FB Neck ROM: full    Dental no notable dental hx.    Pulmonary neg pulmonary ROS,    Pulmonary exam normal breath sounds clear to auscultation       Cardiovascular negative cardio ROS Normal cardiovascular exam Rhythm:regular Rate:Normal     Neuro/Psych negative neurological ROS  negative psych ROS   GI/Hepatic negative GI ROS, Neg liver ROS,   Endo/Other  negative endocrine ROS  Renal/GU negative Renal ROS     Musculoskeletal negative musculoskeletal ROS (+)   Abdominal Normal abdominal exam  (+)   Peds  Hematology negative hematology ROS (+)   Anesthesia Other Findings   Reproductive/Obstetrics (+) Pregnancy                             Anesthesia Physical Anesthesia Plan  ASA: II  Anesthesia Plan: Spinal   Post-op Pain Management:    Induction:   PONV Risk Score and Plan: 3 and Ondansetron, Dexamethasone and Scopolamine patch - Pre-op  Airway Management Planned: Nasal Cannula and Natural Airway  Additional Equipment: None  Intra-op Plan:   Post-operative Plan:   Informed Consent: I have reviewed the patients History and Physical, chart, labs and discussed the procedure including the risks, benefits and alternatives for the proposed anesthesia with the patient or authorized representative who has indicated his/her understanding and acceptance.       Plan Discussed with: CRNA  Anesthesia Plan Comments:         Anesthesia Quick Evaluation

## 2021-01-06 NOTE — Transfer of Care (Signed)
Immediate Anesthesia Transfer of Care Note  Patient: Deanna Aguirre  Procedure(s) Performed: PRIMARY CESAREAN SECTION EDC: 02-17-21 ALLERG: NKDA (N/A Abdomen)  Patient Location: PACU  Anesthesia Type:Spinal  Level of Consciousness: awake, alert  and oriented  Airway & Oxygen Therapy: Patient Spontanous Breathing  Post-op Assessment: Report given to RN and Post -op Vital signs reviewed and stable  Post vital signs: Reviewed and stable  Last Vitals:  Vitals Value Taken Time  BP 96/61 01/06/21 1005  Temp    Pulse 64 01/06/21 1010  Resp 9 01/06/21 1010  SpO2 100 % 01/06/21 1010  Vitals shown include unvalidated device data.  Last Pain:  Vitals:   01/06/21 0754  TempSrc: Oral  PainSc:       Patients Stated Pain Goal: 2 (12/24/20 2000)  Complications: No complications documented.

## 2021-01-06 NOTE — Progress Notes (Signed)
Pt in [place] 

## 2021-01-07 LAB — CBC
HCT: 31 % — ABNORMAL LOW (ref 36.0–46.0)
Hemoglobin: 10 g/dL — ABNORMAL LOW (ref 12.0–15.0)
MCH: 27.5 pg (ref 26.0–34.0)
MCHC: 32.3 g/dL (ref 30.0–36.0)
MCV: 85.4 fL (ref 80.0–100.0)
Platelets: 209 10*3/uL (ref 150–400)
RBC: 3.63 MIL/uL — ABNORMAL LOW (ref 3.87–5.11)
RDW: 13.1 % (ref 11.5–15.5)
WBC: 7.4 10*3/uL (ref 4.0–10.5)
nRBC: 0 % (ref 0.0–0.2)

## 2021-01-07 MED ORDER — DEXAMETHASONE SODIUM PHOSPHATE 10 MG/ML IJ SOLN
INTRAMUSCULAR | Status: AC
  Filled 2021-01-07: qty 1

## 2021-01-07 MED ORDER — METOCLOPRAMIDE HCL 5 MG/ML IJ SOLN
INTRAMUSCULAR | Status: AC
  Filled 2021-01-07: qty 2

## 2021-01-07 NOTE — Plan of Care (Signed)
  Problem: Activity: Goal: Risk for activity intolerance will decrease Outcome: Completed/Met   Problem: Nutrition: Goal: Adequate nutrition will be maintained Outcome: Completed/Met   Problem: Coping: Goal: Level of anxiety will decrease Outcome: Completed/Met   Problem: Elimination: Goal: Will not experience complications related to urinary retention Outcome: Completed/Met   Problem: Education: Goal: Knowledge of disease or condition will improve Outcome: Completed/Met Goal: Knowledge of the prescribed therapeutic regimen will improve Outcome: Completed/Met Goal: Individualized Educational Video(s) Outcome: Completed/Met   Problem: Clinical Measurements: Goal: Complications related to the disease process, condition or treatment will be avoided or minimized Outcome: Completed/Met   Problem: Education: Goal: Knowledge of condition will improve Outcome: Completed/Met   Problem: Activity: Goal: Will verbalize the importance of balancing activity with adequate rest periods Outcome: Completed/Met   Problem: Coping: Goal: Ability to identify and utilize available resources and services will improve Outcome: Completed/Met   Problem: Life Cycle: Goal: Chance of risk for complications during the postpartum period will decrease Outcome: Completed/Met   Problem: Role Relationship: Goal: Ability to demonstrate positive interaction with newborn will improve Outcome: Completed/Met

## 2021-01-07 NOTE — Plan of Care (Signed)
  Problem: Education: Goal: Knowledge of General Education information will improve Description: Including pain rating scale, medication(s)/side effects and non-pharmacologic comfort measures Outcome: Completed/Met   Problem: Activity: Goal: Risk for activity intolerance will decrease Outcome: Completed/Met   Problem: Nutrition: Goal: Adequate nutrition will be maintained Outcome: Completed/Met   Problem: Coping: Goal: Level of anxiety will decrease Outcome: Completed/Met   Problem: Elimination: Goal: Will not experience complications related to bowel motility Outcome: Completed/Met Goal: Will not experience complications related to urinary retention Outcome: Completed/Met   Problem: Pain Managment: Goal: General experience of comfort will improve Outcome: Completed/Met   Problem: Education: Goal: Knowledge of disease or condition will improve Outcome: Completed/Met Goal: Knowledge of the prescribed therapeutic regimen will improve Outcome: Completed/Met Goal: Individualized Educational Video(s) Outcome: Completed/Met   Problem: Clinical Measurements: Goal: Complications related to the disease process, condition or treatment will be avoided or minimized Outcome: Completed/Met   Problem: Education: Goal: Knowledge of condition will improve Outcome: Completed/Met Goal: Individualized Educational Video(s) Outcome: Completed/Met   Problem: Activity: Goal: Will verbalize the importance of balancing activity with adequate rest periods Outcome: Completed/Met Goal: Ability to tolerate increased activity will improve Outcome: Completed/Met   Problem: Coping: Goal: Ability to identify and utilize available resources and services will improve Outcome: Completed/Met   Problem: Life Cycle: Goal: Chance of risk for complications during the postpartum period will decrease Outcome: Completed/Met   Problem: Role Relationship: Goal: Ability to demonstrate positive  interaction with newborn will improve Outcome: Completed/Met

## 2021-01-07 NOTE — Progress Notes (Signed)
Subjective: Postpartum Day 1: Cesarean Delivery Patient reports tolerating PO, + flatus and no problems voiding.    Objective: Vital signs in last 24 hours: Temp:  [97.5 F (36.4 C)-98.3 F (36.8 C)] 98.1 F (36.7 C) (04/14 0715) Pulse Rate:  [45-89] 63 (04/14 0715) Resp:  [0-28] 16 (04/14 0715) BP: (85-140)/(54-103) 88/55 (04/14 0715) SpO2:  [96 %-100 %] 99 % (04/14 0715)  Physical Exam:  General: alert, cooperative and no distress Lochia: appropriate Uterine Fundus: firm Incision: healing well DVT Evaluation: No evidence of DVT seen on physical exam.  Recent Labs    01/06/21 0836 01/07/21 0440  HGB 11.4* 10.0*  HCT 35.1* 31.0*    Assessment/Plan: Status post Cesarean section. Doing well postoperatively.  Continue current care.  Roselle Locus II 01/07/2021, 8:03 AM

## 2021-01-07 NOTE — Clinical Social Work Maternal (Signed)
CLINICAL SOCIAL WORK MATERNAL/CHILD NOTE  Patient Details  Name: Deanna Aguirre MRN: [id number] Date of Birth: Dec 27, 1990  Date:  01/07/2021  Clinical Social Worker Initiating Note:  Abundio Miu, [place] Date/Time: Initiated:  01/07/21/1314     Child's Name:  Hildred Priest   Biological Parents:  Mother,Father (Father: Steph Cheadle)   Need for Interpreter:  None   Reason for Referral:  [place] (Comment) (NICU Admission)   Address:  [place] [address]    Phone number:  [phone] (home)     Additional phone number:   Household Members/Support Persons (HM/SP):   Household Member/Support Person 1,Household Member/Support Person 2,Household Member/Support Person 3,Household Member/Support Person 4   HM/SP Name Relationship DOB or Age  HM/SP -[place] Husband; FOB    HM/SP -2 Janellie Tennison daughter 09/16/17  HM/SP -3 Brooklynn Hargraves daughter 06/29/15  HM/SP -4 Ava Stevens daughter 12/26/10  HM/SP -5        HM/SP -6        HM/SP -7        HM/SP -8          Natural Supports (not living in the home):  Immediate Family,Extended Surveyor, quantity Supports: None   Employment: Animator   Type of Work: Print production planner   Education:  Forensic psychologist   Homebound arranged:    Museum/gallery curator Resources:  Multimedia programmer   Other Resources:      Cultural/Religious Considerations Which May Impact Care:    Strengths:  Ability to meet basic needs ,Pediatrician chosen,Understanding of illness   Psychotropic Medications:         Pediatrician:    [facility]  Pediatrician List:   [employer] [place]      Pediatrician Fax Number:    Risk Factors/Current Problems:  Mental Health Concerns    Cognitive State:  Able to Concentrate ,Alert ,Insightful ,Goal Oriented ,Linear Thinking     Mood/Affect:  Comfortable ,Calm ,Happy ,Relaxed ,Interested    CSW Assessment: CSW met with MOB at bedside to discuss infant's NICU admission and behavioral health concerns, FOB present. CSW introduced self and explained reason for visit. MOB granted CSW verbal permission to speak in front of FOB about anything. MOB was welcoming, pleasant and remained engaged during assessment. MOB reported that she resides with husband/FOB and 3 daughters. 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 MOB reported that they started to shop for infant. MOB reported that they have a car seat and basinet. CSW informed MOB about [facility] [employer] [facility] [employer] if any assistance is needed obtaining items for infant. CSW inquired about MOB's support system, MOB reported that their family and friends are supports.   CSW inquired about MOB's mental health history. MOB reported that she experienced a little postpartum depression after having their third daughter. MOB shared that her daughter was colicky and she wanted to isolate herself from the baby crying at times. 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 MOB reported that she then just worked through it for about 1-2 months. MOB denied any other mental health history. CSW inquired about how MOB was feeling emotionally after giving birth, MOB reported that she was feeling fine. MOB presented calm and did not demonstrate any acute mental health  signs/symptoms. CSW assessed for safety, MOB denied SI and HI. CSW did not assess for domestic violence as FOB was present.   CSW provided education regarding the baby blues period vs. perinatal mood disorders, discussed treatment and gave resources for mental health follow up if concerns arise.  CSW recommends self-evaluation during the postpartum time period using the New Mom Checklist from Postpartum Progress and encouraged MOB to contact a medical  professional if symptoms are noted at any time.    CSW provided review of Sudden Infant Death Syndrome (SIDS) precautions.    CSW and parents discussed the infant's NICU admission. CSW informed parents about the NICU, what to expect and resources/supports available while infant is admitted to the NICU. MOB reported that they feel well informed about infant's care. MOB denied any transportation barriers with visiting infant in the NICU. Parents denied any questions/concerns regarding the NICU. MOB reported that she does not need CSW to check in and will call CSW if any needs arise.   CSW identifies no further need for intervention and no barriers to discharge at this time.   CSW Plan/Description:  No Further Intervention Required/No Barriers to Discharge,Sudden Infant Death Syndrome (SIDS) Education,Perinatal Mood and Anxiety Disorder (PMADs) [facility] Patient/Family Education    Burnis Medin, LCSW 01/07/2021, 1:17 PM

## 2021-01-08 LAB — SURGICAL PATHOLOGY

## 2021-01-08 MED ORDER — OXYCODONE HCL 5 MG PO TABS: 5.0000 mg | ORAL_TABLET | ORAL | 0 refills | Status: AC | PRN

## 2021-01-08 MED ORDER — IBUPROFEN 800 MG PO TABS: 800.0000 mg | ORAL_TABLET | Freq: Four times a day (QID) | ORAL | 0 refills | Status: AC

## 2021-01-08 MED ORDER — ACETAMINOPHEN 325 MG PO TABS: 650.0000 mg | ORAL_TABLET | Freq: Four times a day (QID) | ORAL | 0 refills | Status: AC | PRN

## 2021-01-08 NOTE — Plan of Care (Signed)
  Problem: Health Behavior/Discharge Planning: Goal: Ability to manage health-related needs will improve Outcome: Adequate for Discharge   Problem: Clinical Measurements: Goal: Ability to maintain clinical measurements within normal limits will improve Outcome: Adequate for Discharge Goal: Will remain free from infection Outcome: Adequate for Discharge Goal: Diagnostic test results will improve Outcome: Adequate for Discharge Goal: Respiratory complications will improve Outcome: Adequate for Discharge Goal: Cardiovascular complication will be avoided Outcome: Adequate for Discharge   Problem: Safety: Goal: Ability to remain free from injury will improve Outcome: Adequate for Discharge   Problem: Skin Integrity: Goal: Risk for impaired skin integrity will decrease Outcome: Adequate for Discharge   Problem: Education: Goal: Individualized Newborn Educational Video(s) Outcome: Adequate for Discharge   Problem: Skin Integrity: Goal: Demonstration of wound healing without infection will improve Outcome: Adequate for Discharge

## 2021-01-08 NOTE — Progress Notes (Signed)
Postpartum Progress Note  Postpartum Day 2 s/p primary Cesarean section.  Subjective:  Patient reports no overnight events.  She reports well controlled pain, ambulating without difficulty, voiding spontaneously, tolerating PO.  She reports Positive flatus, Positive BM.  Vaginal bleeding is minimal.  Objective: Blood pressure 109/63, pulse 73, temperature 99.2 F (37.3 C), temperature source Oral, resp. rate 16, height 5\' 3"  (1.6 m), weight 78.9 kg, last menstrual period 05/13/2020, SpO2 96 %, unknown if currently breastfeeding.  Physical Exam:  General: alert and no distress Lochia: appropriate Uterine Fundus: firm Incision: dressing in place DVT Evaluation: No evidence of DVT seen on physical exam.  Recent Labs    01/06/21 0836 01/07/21 0440  HGB 11.4* 10.0*  HCT 35.1* 31.0*    Assessment/Plan: . Postpartum Day 2, s/p C-section . Lactation following . Doing well, continue routine postpartum care. Anticipate discharge today, which patient strongly desires.    LOS: 16 days   01/09/21 01/08/2021, 7:38 AM

## 2021-01-08 NOTE — Progress Notes (Signed)
D/c instructions given, all questions answered, and pt verbalized understanding. Pt is alert and oriented x 4, ambulatory, and states no pain.

## 2021-01-09 NOTE — Discharge Summary (Signed)
Obstetric Discharge Summary  Deanna Aguirre is a 30 y.o. female that presented on 12/23/2020 for PPROM at [redacted] weeks gestation. She was admitted to antepartum for inpatient management of PPROM.  She received betamethasone and remained pregnant until planned delivery at 34 weeks via C section for malpresentation/unstable lie. She delivered a viable female infant on 01/06/21.  Her postpartum course was uncomplicated and on PPD#2, she reported well controlled pain, spontaneous voiding, ambulating without difficulty, and tolerating PO.  She was stable for discharge home on 01/08/21 with plans for in-office follow up.  Hemoglobin  Date Value Ref Range Status  01/07/2021 10.0 (L) 12.0 - 15.0 g/dL Final   HCT  Date Value Ref Range Status  01/07/2021 31.0 (L) 36.0 - 46.0 % Final    Physical Exam:  General: alert and no distress Lochia: appropriate Uterine Fundus: firm Incision: healing well DVT Evaluation: No evidence of DVT seen on physical exam.  Discharge Diagnoses: PPROM, delivery at 34 weeks  Discharge Information: Date: 01/09/2021 Activity: pelvic rest and as tolerated Diet: routine Medications: tylenol, motrin, oxycodone Condition: stable Instructions: refer to practice specific booklet Discharge to: home  Follow-up Information    [facility] Follow up.   Why: Please follow up for 6 week postpartum visit.  Contact information: [address] [place] [address] [phone]               Newborn Data: Live born female  Birth Weight: 5 lb 8.5 oz (2510 g) APGAR: 9, 8  Newborn Delivery   Birth date/time: 01/06/2021 09:13:00 Delivery type: C-Section, Low Transverse Trial of labor: No C-section categorization: Primary      NICU care, 34 weeks at delivery.  Deanna Aguirre 01/09/2021, 9:47 PM

## 2021-01-19 NOTE — Anesthesia Postprocedure Evaluation (Signed)
Anesthesia Post Note  Patient: Deanna Aguirre  Procedure(s) Performed: PRIMARY CESAREAN SECTION EDC: 02-17-21 ALLERG: NKDA (N/A Abdomen)     Patient location during evaluation: PACU Anesthesia Type: Spinal Level of consciousness: awake Pain management: pain level controlled Vital Signs Assessment: post-procedure vital signs reviewed and stable Respiratory status: spontaneous breathing Cardiovascular status: stable Postop Assessment: no apparent nausea or vomiting Anesthetic complications: no   No complications documented.  Last Vitals:  Vitals:   01/08/21 0753 01/08/21 0921  BP: (!) 83/51 105/61  Pulse: 68 70  Resp: 16 16  Temp: 36.9 C   SpO2: 98% 99%    Last Pain:  Vitals:   01/08/21 0804  TempSrc:   PainSc: 0-No pain                 Caren Macadam

## 2022-04-13 IMAGING — CT CT RENAL STONE PROTOCOL
2 of 4 series · 17 of 46 positions shown, 19 images · non-contrast
Comparison: None.

CLINICAL DATA: Right-sided flank pain, hematuria

EXAM:
CT ABDOMEN AND PELVIS WITHOUT CONTRAST
TECHNIQUE: Multidetector CT imaging of the abdomen and pelvis was performed
following the standard protocol without IV contrast.

[Series 3: stone study 5.0 i30f 2 · axial · 0.86mm/px · z∈[+659,+1099]mm · 14 of 98 slices shown, 16 images]
[im 5/98  soft-tissue]
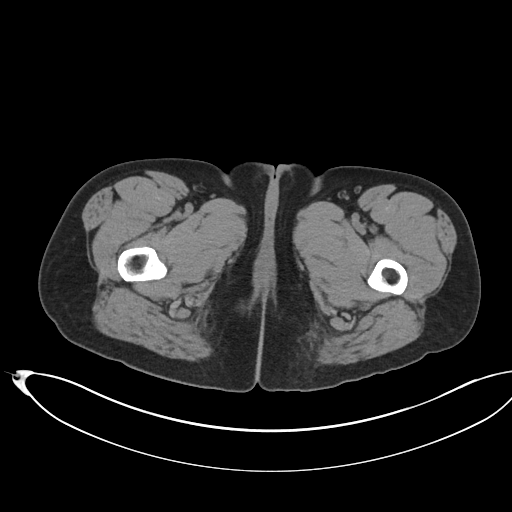
[im 5/98  bone]
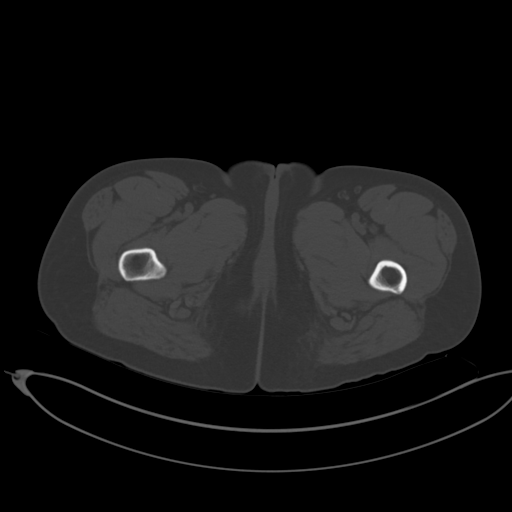
[im 13/98  soft-tissue]
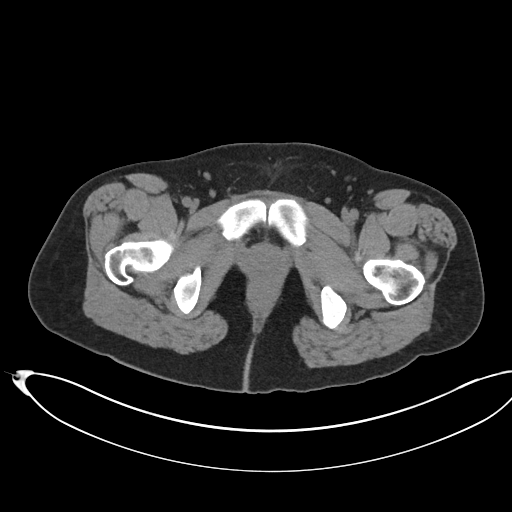
[im 21/98  soft-tissue]
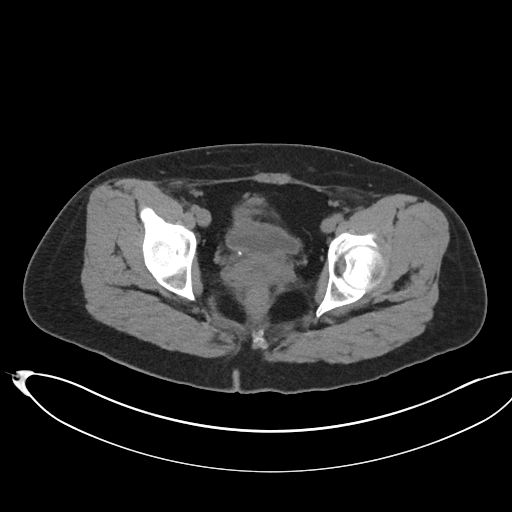
[im 25/98  soft-tissue]
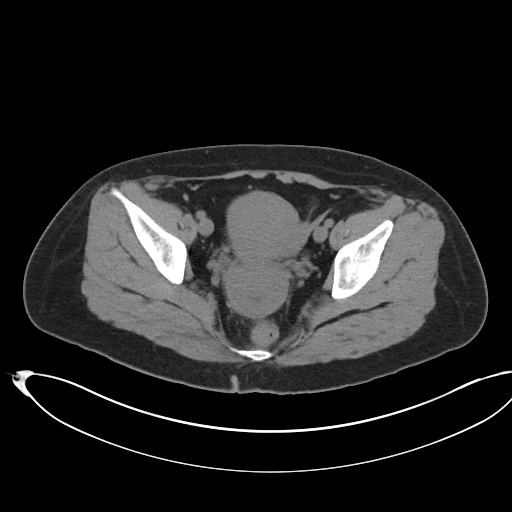
[im 33/98  soft-tissue]
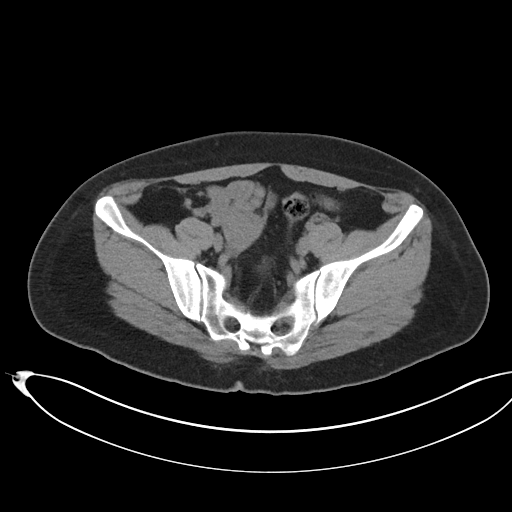
[im 41/98  soft-tissue]
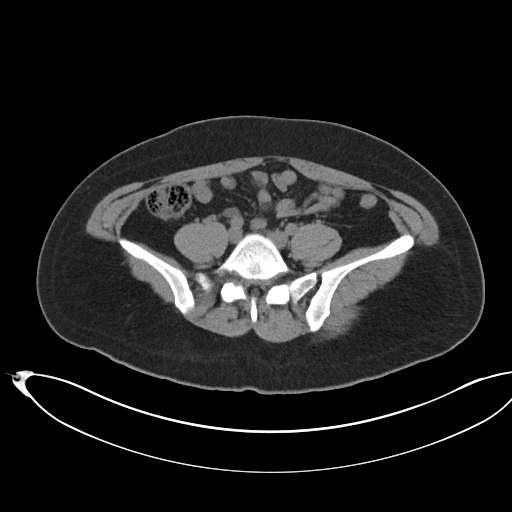
[im 45/98  soft-tissue]
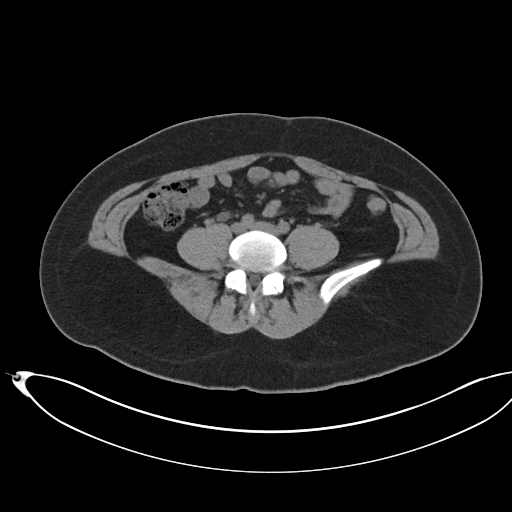
[im 53/98  soft-tissue]
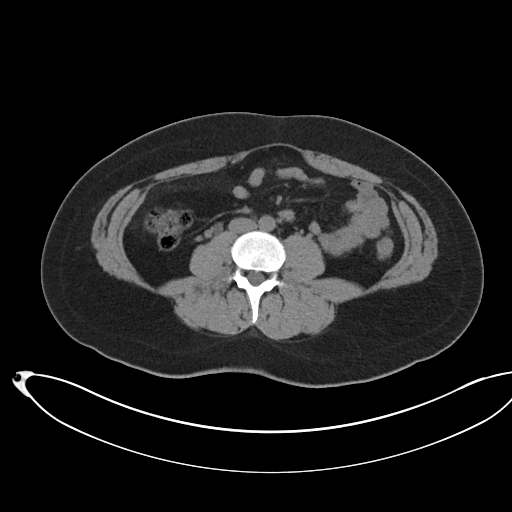
[im 57/98  soft-tissue]
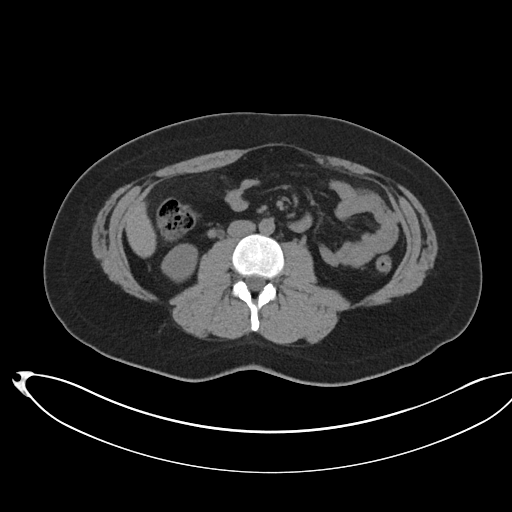
[im 57/98  bone]
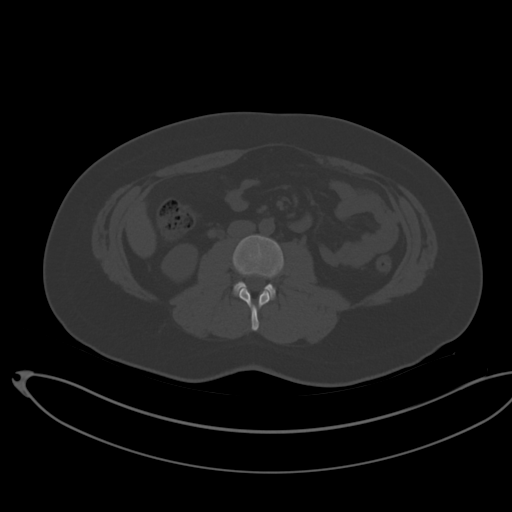
[im 65/98  soft-tissue]
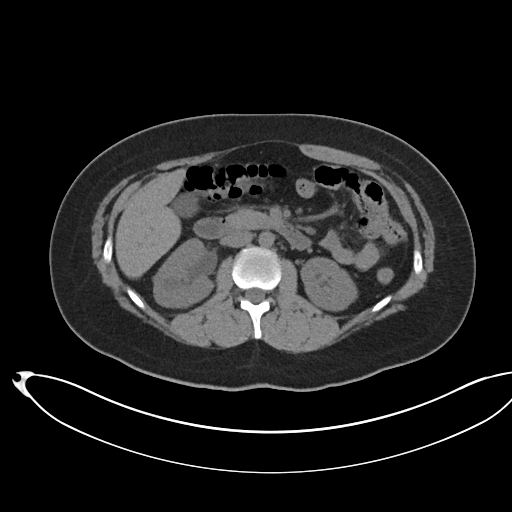
[im 73/98  soft-tissue]
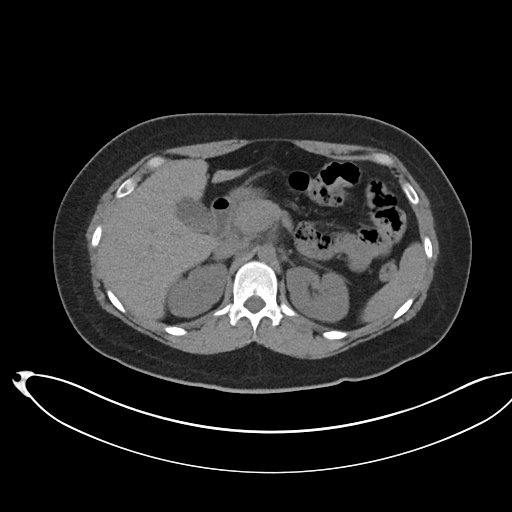
[im 77/98  soft-tissue]
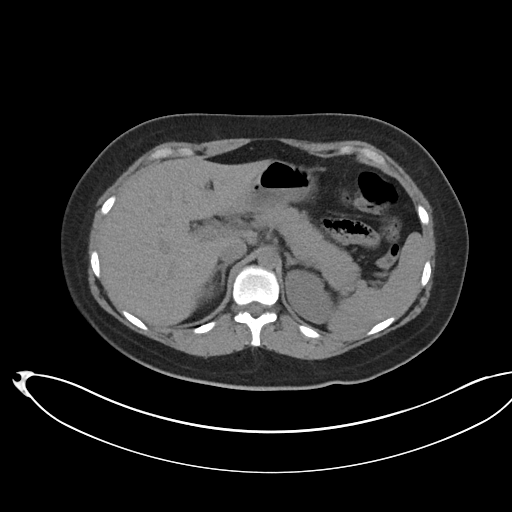
[im 85/98  soft-tissue]
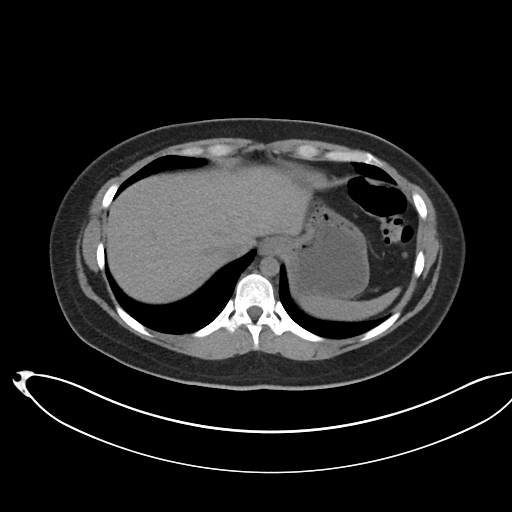
[im 93/98  soft-tissue]
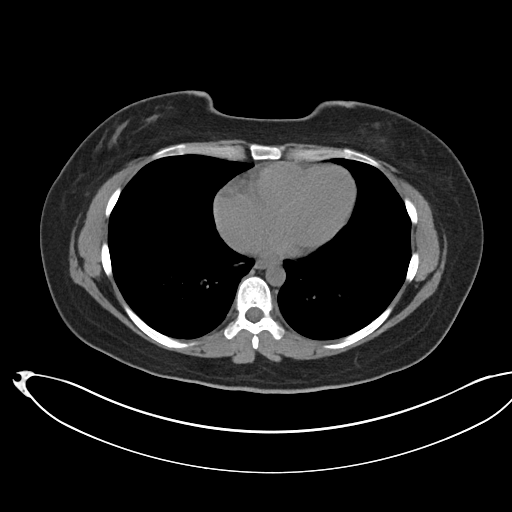

[Series 6: coronal soft tissue · coronal · 0.96mm/px · 3 of 98 slices shown]
[im 33/98  soft-tissue]
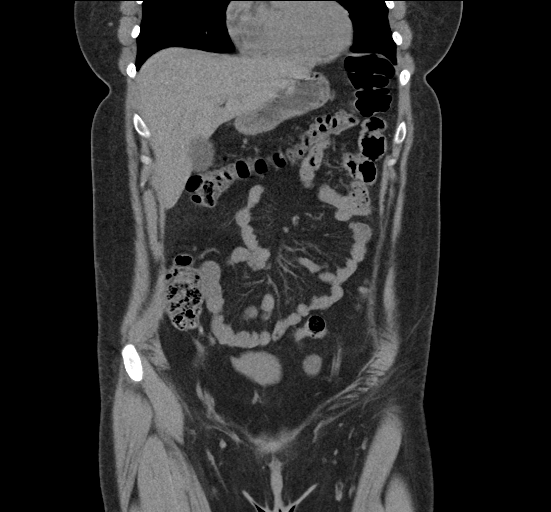
[im 44/98  soft-tissue]
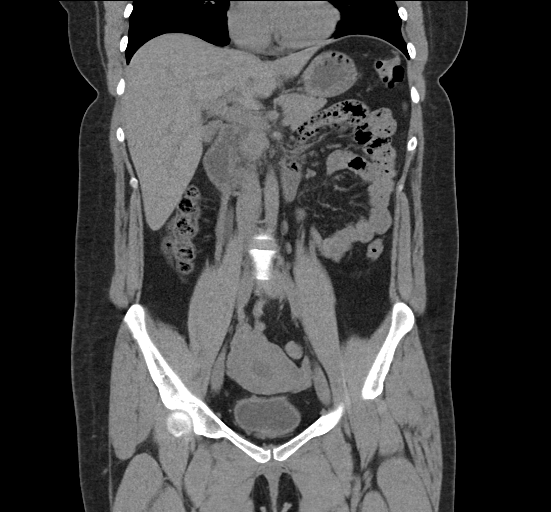
[im 54/98  soft-tissue]
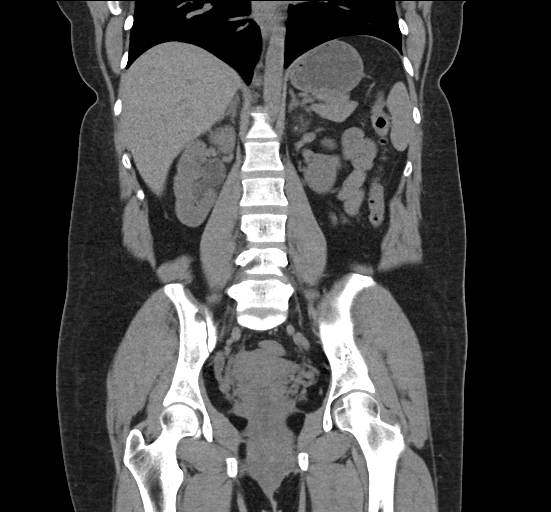

[17 of 46 positions shown; findings below may reference images not displayed]

FINDINGS: Lower chest: No acute pleural or parenchymal lung disease.

Hepatobiliary: No focal liver abnormality is seen. No gallstones,
gallbladder wall thickening, or biliary dilatation.

Pancreas: Unremarkable. No pancreatic ductal dilatation or
surrounding inflammatory changes.

Spleen: Normal in size without focal abnormality.

Adrenals/Urinary Tract: There is mild right-sided obstructive
uropathy related to a 3 mm obstructing right UVJ calculus reference
image 78. Left kidney is unremarkable. The bladder is decompressed
which limits evaluation. The adrenals are normal.

Stomach/Bowel: No bowel obstruction or ileus. Normal appendix right
lower quadrant. No bowel wall thickening or inflammatory change.

Vascular/Lymphatic: No significant vascular findings are present. No
enlarged abdominal or pelvic lymph nodes.

Reproductive: Uterus and bilateral adnexa are unremarkable.

Other: No abdominal wall hernia or abnormality. No abdominopelvic
ascites.

Musculoskeletal: No acute or destructive bony lesions. Reconstructed
images demonstrate no additional findings.
IMPRESSION: 1. Right-sided obstructive uropathy related to a 3 mm right UVJ
calculus.
